# Patient Record
Sex: Female | Born: 1970
Health system: Southern US, Community
[De-identification: ages and names within clinical notes are randomized; demographics above are authoritative.]

## PROBLEM LIST (undated history)

## (undated) DIAGNOSIS — E039 Hypothyroidism, unspecified: Secondary | ICD-10-CM

## (undated) DIAGNOSIS — K219 Gastro-esophageal reflux disease without esophagitis: Secondary | ICD-10-CM

## (undated) HISTORY — PX: WISDOM TOOTH EXTRACTION: SHX21

---

## 1998-12-24 ENCOUNTER — Inpatient Hospital Stay (HOSPITAL_COMMUNITY): Admission: AD | Admit: 1998-12-24 | Discharge: 1998-12-27 | Payer: Self-pay | Admitting: Obstetrics & Gynecology

## 1999-01-26 ENCOUNTER — Other Ambulatory Visit: Admission: RE | Admit: 1999-01-26 | Discharge: 1999-01-26 | Payer: Self-pay | Admitting: Obstetrics & Gynecology

## 1999-05-26 ENCOUNTER — Encounter: Payer: Self-pay | Admitting: Family Medicine

## 1999-05-26 ENCOUNTER — Ambulatory Visit (HOSPITAL_COMMUNITY): Admission: RE | Admit: 1999-05-26 | Discharge: 1999-05-26 | Payer: Self-pay | Admitting: Family Medicine

## 1999-10-04 ENCOUNTER — Emergency Department (HOSPITAL_COMMUNITY): Admission: EM | Admit: 1999-10-04 | Discharge: 1999-10-04 | Payer: Self-pay | Admitting: Emergency Medicine

## 2000-03-01 ENCOUNTER — Other Ambulatory Visit: Admission: RE | Admit: 2000-03-01 | Discharge: 2000-03-01 | Payer: Self-pay | Admitting: Obstetrics & Gynecology

## 2001-04-20 ENCOUNTER — Other Ambulatory Visit: Admission: RE | Admit: 2001-04-20 | Discharge: 2001-04-20 | Payer: Self-pay | Admitting: Obstetrics & Gynecology

## 2002-05-08 ENCOUNTER — Other Ambulatory Visit: Admission: RE | Admit: 2002-05-08 | Discharge: 2002-05-08 | Payer: Self-pay | Admitting: Obstetrics and Gynecology

## 2003-05-30 ENCOUNTER — Other Ambulatory Visit: Admission: RE | Admit: 2003-05-30 | Discharge: 2003-05-30 | Payer: Self-pay | Admitting: Obstetrics & Gynecology

## 2004-04-15 ENCOUNTER — Encounter: Admission: RE | Admit: 2004-04-15 | Discharge: 2004-04-15 | Payer: Self-pay | Admitting: Family Medicine

## 2004-06-11 ENCOUNTER — Other Ambulatory Visit: Admission: RE | Admit: 2004-06-11 | Discharge: 2004-06-11 | Payer: Self-pay | Admitting: Obstetrics & Gynecology

## 2004-06-17 ENCOUNTER — Ambulatory Visit (HOSPITAL_COMMUNITY): Admission: RE | Admit: 2004-06-17 | Discharge: 2004-06-17 | Payer: Self-pay | Admitting: Obstetrics & Gynecology

## 2005-08-03 ENCOUNTER — Other Ambulatory Visit: Admission: RE | Admit: 2005-08-03 | Discharge: 2005-08-03 | Payer: Self-pay | Admitting: Obstetrics & Gynecology

## 2013-08-28 ENCOUNTER — Other Ambulatory Visit: Payer: Self-pay | Admitting: Physician Assistant

## 2013-08-28 DIAGNOSIS — R1011 Right upper quadrant pain: Secondary | ICD-10-CM

## 2013-09-05 ENCOUNTER — Ambulatory Visit
Admission: RE | Admit: 2013-09-05 | Discharge: 2013-09-05 | Disposition: A | Discharge status: Home / Self Care | Payer: 59 | Source: Ambulatory Visit | Attending: Physician Assistant | Admitting: Physician Assistant

## 2013-09-05 DIAGNOSIS — R1011 Right upper quadrant pain: Secondary | ICD-10-CM

## 2014-12-24 ENCOUNTER — Other Ambulatory Visit: Payer: Self-pay | Admitting: Obstetrics & Gynecology

## 2014-12-24 DIAGNOSIS — R928 Other abnormal and inconclusive findings on diagnostic imaging of breast: Secondary | ICD-10-CM

## 2015-01-02 ENCOUNTER — Ambulatory Visit
Admission: RE | Admit: 2015-01-02 | Discharge: 2015-01-02 | Disposition: A | Discharge status: Home / Self Care | Payer: 59 | Source: Ambulatory Visit | Attending: Obstetrics & Gynecology | Admitting: Obstetrics & Gynecology

## 2015-01-02 DIAGNOSIS — R928 Other abnormal and inconclusive findings on diagnostic imaging of breast: Secondary | ICD-10-CM

## 2015-05-21 DIAGNOSIS — H524 Presbyopia: Secondary | ICD-10-CM | POA: Diagnosis not present

## 2015-05-21 DIAGNOSIS — H52223 Regular astigmatism, bilateral: Secondary | ICD-10-CM | POA: Diagnosis not present

## 2015-05-21 DIAGNOSIS — G44009 Cluster headache syndrome, unspecified, not intractable: Secondary | ICD-10-CM | POA: Diagnosis not present

## 2015-05-21 DIAGNOSIS — H5213 Myopia, bilateral: Secondary | ICD-10-CM | POA: Diagnosis not present

## 2015-05-21 DIAGNOSIS — H43393 Other vitreous opacities, bilateral: Secondary | ICD-10-CM | POA: Diagnosis not present

## 2015-07-17 MED FILL — SYNTHROID 125 MCG TABLET: 125 | 90 days supply | Qty: 90 | Fill #0

## 2015-09-09 DIAGNOSIS — R040 Epistaxis: Secondary | ICD-10-CM | POA: Diagnosis not present

## 2015-10-01 DIAGNOSIS — Z0000 Encounter for general adult medical examination without abnormal findings: Secondary | ICD-10-CM | POA: Diagnosis not present

## 2015-10-01 DIAGNOSIS — E039 Hypothyroidism, unspecified: Secondary | ICD-10-CM | POA: Diagnosis not present

## 2015-10-01 DIAGNOSIS — L309 Dermatitis, unspecified: Secondary | ICD-10-CM | POA: Diagnosis not present

## 2015-10-01 DIAGNOSIS — E559 Vitamin D deficiency, unspecified: Secondary | ICD-10-CM | POA: Diagnosis not present

## 2015-10-01 DIAGNOSIS — E663 Overweight: Secondary | ICD-10-CM | POA: Diagnosis not present

## 2015-10-01 DIAGNOSIS — Z6825 Body mass index (BMI) 25.0-25.9, adult: Secondary | ICD-10-CM | POA: Diagnosis not present

## 2015-10-01 MED FILL — TRIAMCINOLONE 0.1% CREAM: 0.1 | 7 days supply | Qty: 30 | Fill #0

## 2015-10-02 DIAGNOSIS — R040 Epistaxis: Secondary | ICD-10-CM | POA: Diagnosis not present

## 2015-10-02 MED FILL — SYNTHROID 112 MCG TABLET: 112 | 90 days supply | Qty: 90 | Fill #0

## 2015-11-19 DIAGNOSIS — E039 Hypothyroidism, unspecified: Secondary | ICD-10-CM | POA: Diagnosis not present

## 2015-12-31 MED FILL — SYNTHROID 112 MCG TABLET: 112 | 90 days supply | Qty: 90 | Fill #1

## 2016-01-12 DIAGNOSIS — Z780 Asymptomatic menopausal state: Secondary | ICD-10-CM | POA: Diagnosis not present

## 2016-01-12 DIAGNOSIS — Z1231 Encounter for screening mammogram for malignant neoplasm of breast: Secondary | ICD-10-CM | POA: Diagnosis not present

## 2016-01-12 DIAGNOSIS — Z01419 Encounter for gynecological examination (general) (routine) without abnormal findings: Secondary | ICD-10-CM | POA: Diagnosis not present

## 2016-01-12 DIAGNOSIS — Z6826 Body mass index (BMI) 26.0-26.9, adult: Secondary | ICD-10-CM | POA: Diagnosis not present

## 2016-01-12 DIAGNOSIS — R5383 Other fatigue: Secondary | ICD-10-CM | POA: Diagnosis not present

## 2016-01-21 DIAGNOSIS — Z13820 Encounter for screening for osteoporosis: Secondary | ICD-10-CM | POA: Diagnosis not present

## 2016-01-21 DIAGNOSIS — N951 Menopausal and female climacteric states: Secondary | ICD-10-CM | POA: Diagnosis not present

## 2016-01-21 MED FILL — PROGESTERONE 100 MG CAPSULE: 100 | 90 days supply | Qty: 90 | Fill #0

## 2016-01-23 MED FILL — PREMARIN 0.625 MG TABLET: 0.625 | 90 days supply | Qty: 90 | Fill #0

## 2016-02-23 DIAGNOSIS — H578 Other specified disorders of eye and adnexa: Secondary | ICD-10-CM | POA: Diagnosis not present

## 2016-02-23 DIAGNOSIS — Z6826 Body mass index (BMI) 26.0-26.9, adult: Secondary | ICD-10-CM | POA: Diagnosis not present

## 2016-02-23 MED FILL — AZELASTINE HCL 0.05% DROPS: 0.05 | 30 days supply | Qty: 6 | Fill #0

## 2016-03-22 DIAGNOSIS — H1045 Other chronic allergic conjunctivitis: Secondary | ICD-10-CM | POA: Diagnosis not present

## 2016-03-22 MED FILL — PAZEO 0.7% EYE DROPS: 0.7 | 25 days supply | Qty: 3 | Fill #0

## 2016-03-22 MED FILL — FLUOROMETHOLONE 0.1% DROPS: 0.1 | 13 days supply | Qty: 5 | Fill #0

## 2016-04-05 MED FILL — SYNTHROID 112 MCG TABLET: 112 | 90 days supply | Qty: 90 | Fill #2

## 2016-05-18 DIAGNOSIS — L308 Other specified dermatitis: Secondary | ICD-10-CM | POA: Diagnosis not present

## 2016-05-18 MED FILL — TETRIX CRM: 45 days supply | Qty: 90 | Fill #0

## 2016-05-18 MED FILL — CLOBETASOL 0.05% CREAM: 0.05 | 14 days supply | Qty: 60 | Fill #0

## 2016-07-05 DIAGNOSIS — Z0182 Encounter for allergy testing: Secondary | ICD-10-CM | POA: Diagnosis not present

## 2016-07-05 DIAGNOSIS — L239 Allergic contact dermatitis, unspecified cause: Secondary | ICD-10-CM | POA: Diagnosis not present

## 2016-07-09 DIAGNOSIS — L239 Allergic contact dermatitis, unspecified cause: Secondary | ICD-10-CM | POA: Diagnosis not present

## 2016-07-09 DIAGNOSIS — L2081 Atopic neurodermatitis: Secondary | ICD-10-CM | POA: Diagnosis not present

## 2016-07-09 DIAGNOSIS — Z0182 Encounter for allergy testing: Secondary | ICD-10-CM | POA: Diagnosis not present

## 2016-07-09 MED FILL — hydrOXYzine HCL 10 MG TABS: 10 | 20 days supply | Qty: 60 | Fill #0

## 2016-07-12 MED FILL — SYNTHROID 112 MCG TABLET: 112 | 90 days supply | Qty: 90 | Fill #3

## 2016-09-21 MED FILL — PREMARIN 0.625 MG TABLET: 0.625 | 90 days supply | Qty: 90 | Fill #1

## 2016-09-21 MED FILL — PROGESTERONE 100 MG CAPSULE: 100 | 90 days supply | Qty: 90 | Fill #1

## 2016-09-30 DIAGNOSIS — E538 Deficiency of other specified B group vitamins: Secondary | ICD-10-CM | POA: Diagnosis not present

## 2016-09-30 DIAGNOSIS — Z6827 Body mass index (BMI) 27.0-27.9, adult: Secondary | ICD-10-CM | POA: Diagnosis not present

## 2016-09-30 DIAGNOSIS — Z1389 Encounter for screening for other disorder: Secondary | ICD-10-CM | POA: Diagnosis not present

## 2016-09-30 DIAGNOSIS — R5381 Other malaise: Secondary | ICD-10-CM | POA: Diagnosis not present

## 2016-09-30 DIAGNOSIS — E559 Vitamin D deficiency, unspecified: Secondary | ICD-10-CM | POA: Diagnosis not present

## 2016-09-30 DIAGNOSIS — E039 Hypothyroidism, unspecified: Secondary | ICD-10-CM | POA: Diagnosis not present

## 2016-09-30 DIAGNOSIS — E663 Overweight: Secondary | ICD-10-CM | POA: Diagnosis not present

## 2016-09-30 DIAGNOSIS — L309 Dermatitis, unspecified: Secondary | ICD-10-CM | POA: Diagnosis not present

## 2016-10-07 MED FILL — SYNTHROID 112 MCG TABLET: 112 | 90 days supply | Qty: 90 | Fill #0

## 2016-12-13 DIAGNOSIS — D225 Melanocytic nevi of trunk: Secondary | ICD-10-CM | POA: Diagnosis not present

## 2016-12-13 DIAGNOSIS — L820 Inflamed seborrheic keratosis: Secondary | ICD-10-CM | POA: Diagnosis not present

## 2016-12-13 DIAGNOSIS — L814 Other melanin hyperpigmentation: Secondary | ICD-10-CM | POA: Diagnosis not present

## 2016-12-13 DIAGNOSIS — L821 Other seborrheic keratosis: Secondary | ICD-10-CM | POA: Diagnosis not present

## 2017-01-10 MED FILL — SYNTHROID 112 MCG TABLET: 112 | 90 days supply | Qty: 90 | Fill #1

## 2017-01-10 MED FILL — PROGESTERONE 100 MG CAPSULE: 100 | 90 days supply | Qty: 90 | Fill #2

## 2017-01-10 MED FILL — PREMARIN 0.625 MG TABLET: 0.625 | 90 days supply | Qty: 90 | Fill #2

## 2017-01-20 DIAGNOSIS — Z1231 Encounter for screening mammogram for malignant neoplasm of breast: Secondary | ICD-10-CM | POA: Diagnosis not present

## 2017-02-02 DIAGNOSIS — Z01419 Encounter for gynecological examination (general) (routine) without abnormal findings: Secondary | ICD-10-CM | POA: Diagnosis not present

## 2017-02-02 DIAGNOSIS — Z6827 Body mass index (BMI) 27.0-27.9, adult: Secondary | ICD-10-CM | POA: Diagnosis not present

## 2017-05-12 MED FILL — SYNTHROID 112 MCG TABLET: 112 | 90 days supply | Qty: 90 | Fill #2

## 2017-05-13 MED FILL — PREMARIN 1.25 MG TABLET: 1.25 | 30 days supply | Qty: 30 | Fill #0

## 2017-05-13 MED FILL — PROGESTERONE 100 MG CAPSULE: 100 | 90 days supply | Qty: 90 | Fill #0

## 2017-05-28 DIAGNOSIS — R509 Fever, unspecified: Secondary | ICD-10-CM | POA: Diagnosis not present

## 2017-05-28 DIAGNOSIS — N3001 Acute cystitis with hematuria: Secondary | ICD-10-CM | POA: Diagnosis not present

## 2017-06-01 MED FILL — CIPROFLOXACIN HCL 500 MG TA: 500 | 3 days supply | Qty: 6 | Fill #0

## 2017-06-17 MED FILL — hydrOXYzine HCL 10 MG TABS: 10 | 20 days supply | Qty: 60 | Fill #1

## 2017-07-21 MED FILL — PREMARIN 1.25 MG TABLET: 1.25 | 30 days supply | Qty: 30 | Fill #1

## 2017-08-15 MED FILL — SYNTHROID 112 MCG TABLET: 112 | 90 days supply | Qty: 90 | Fill #3

## 2017-09-01 MED FILL — PROGESTERONE 100 MG CAPSULE: 100 | 90 days supply | Qty: 90 | Fill #0

## 2017-09-01 MED FILL — PREMARIN 1.25 MG TABLET: 1.25 | 30 days supply | Qty: 30 | Fill #2

## 2017-10-19 DIAGNOSIS — Z0000 Encounter for general adult medical examination without abnormal findings: Secondary | ICD-10-CM | POA: Diagnosis not present

## 2017-10-19 DIAGNOSIS — E559 Vitamin D deficiency, unspecified: Secondary | ICD-10-CM | POA: Diagnosis not present

## 2017-10-19 DIAGNOSIS — E039 Hypothyroidism, unspecified: Secondary | ICD-10-CM | POA: Diagnosis not present

## 2017-10-19 DIAGNOSIS — E663 Overweight: Secondary | ICD-10-CM | POA: Diagnosis not present

## 2017-10-19 DIAGNOSIS — Z6825 Body mass index (BMI) 25.0-25.9, adult: Secondary | ICD-10-CM | POA: Diagnosis not present

## 2017-10-19 MED FILL — CLOBETASOL PROPIONATE 0.05: 0.05 | 20 days supply | Qty: 45 | Fill #0

## 2017-10-20 ENCOUNTER — Other Ambulatory Visit: Payer: Self-pay | Admitting: Nurse Practitioner

## 2017-10-20 ENCOUNTER — Other Ambulatory Visit: Payer: Self-pay | Admitting: Physician Assistant

## 2017-10-20 DIAGNOSIS — E049 Nontoxic goiter, unspecified: Secondary | ICD-10-CM

## 2017-10-20 MED FILL — PREMARIN 1.25 MG TABLET: 1.25 | 30 days supply | Qty: 30 | Fill #3

## 2017-10-28 ENCOUNTER — Ambulatory Visit
Admission: RE | Admit: 2017-10-28 | Discharge: 2017-10-28 | Disposition: A | Discharge status: Home / Self Care | Payer: 59 | Source: Ambulatory Visit | Attending: Nurse Practitioner | Admitting: Nurse Practitioner

## 2017-10-28 DIAGNOSIS — E079 Disorder of thyroid, unspecified: Secondary | ICD-10-CM | POA: Diagnosis not present

## 2017-10-28 DIAGNOSIS — E049 Nontoxic goiter, unspecified: Secondary | ICD-10-CM

## 2017-11-16 MED FILL — SYNTHROID 112 MCG TABLET: 112 | 90 days supply | Qty: 90 | Fill #0

## 2017-11-25 MED FILL — PREMARIN 1.25 MG TABLET: 1.25 | 30 days supply | Qty: 30 | Fill #4

## 2017-12-02 ENCOUNTER — Other Ambulatory Visit: Payer: Self-pay | Admitting: General Surgery

## 2017-12-02 DIAGNOSIS — R109 Unspecified abdominal pain: Secondary | ICD-10-CM

## 2017-12-07 ENCOUNTER — Other Ambulatory Visit: Payer: Self-pay | Admitting: General Surgery

## 2017-12-07 ENCOUNTER — Ambulatory Visit
Admission: RE | Admit: 2017-12-07 | Discharge: 2017-12-07 | Disposition: A | Discharge status: Home / Self Care | Payer: 59 | Source: Ambulatory Visit | Attending: General Surgery | Admitting: General Surgery

## 2017-12-07 ENCOUNTER — Encounter (HOSPITAL_COMMUNITY): Payer: Self-pay | Admitting: *Deleted

## 2017-12-07 ENCOUNTER — Other Ambulatory Visit: Payer: Self-pay

## 2017-12-07 DIAGNOSIS — K8020 Calculus of gallbladder without cholecystitis without obstruction: Secondary | ICD-10-CM | POA: Diagnosis not present

## 2017-12-07 DIAGNOSIS — R109 Unspecified abdominal pain: Secondary | ICD-10-CM | POA: Diagnosis not present

## 2017-12-07 NOTE — Anesthesia Preprocedure Evaluation (Addendum)
Anesthesia Evaluation  Patient identified by MRN, date of birth, ID band Patient awake    Reviewed: Allergy & Precautions, H&P , NPO status , Patient's Chart, lab work & pertinent test results  Airway Mallampati: III  TM Distance: >3 FB Neck ROM: Full    Dental no notable dental hx. (+) Teeth Intact, Dental Advisory Given   Pulmonary neg pulmonary ROS,    Pulmonary exam normal breath sounds clear to auscultation       Cardiovascular Exercise Tolerance: Good negative cardio ROS   Rhythm:Regular Rate:Normal     Neuro/Psych negative neurological ROS  negative psych ROS   GI/Hepatic Neg liver ROS, GERD  Medicated,  Endo/Other  Hypothyroidism   Renal/GU negative Renal ROS  negative genitourinary   Musculoskeletal   Abdominal   Peds  Hematology negative hematology ROS (+)   Anesthesia Other Findings   Reproductive/Obstetrics negative OB ROS                            Anesthesia Physical Anesthesia Plan  ASA: II  Anesthesia Plan: General   Post-op Pain Management:    Induction: Intravenous  PONV Risk Score and Plan: 4 or greater and Ondansetron, Dexamethasone and Midazolam  Airway Management Planned: Oral ETT  Additional Equipment:   Intra-op Plan:   Post-operative Plan: Extubation in OR  Informed Consent: I have reviewed the patients History and Physical, chart, labs and discussed the procedure including the risks, benefits and alternatives for the proposed anesthesia with the patient or authorized representative who has indicated his/her understanding and acceptance.   Dental advisory given  Plan Discussed with: CRNA  Anesthesia Plan Comments:         Anesthesia Quick Evaluation

## 2017-12-07 NOTE — Progress Notes (Addendum)
Spoke with pt's husband, Lynnae Sandhoff for pre-op call. Pt was taking daughter to college and not available. Lynnae Sandhoff states that pt does not have a cardiac history, HTN, or diabetes. Lynnae Sandhoff is to come by and pick up the Ensure drink this afternoon.Instructed him to have pt drink it by 4:30 AM.

## 2017-12-08 ENCOUNTER — Ambulatory Visit (HOSPITAL_COMMUNITY): Payer: 59 | Admitting: Certified Registered Nurse Anesthetist

## 2017-12-08 ENCOUNTER — Encounter (HOSPITAL_COMMUNITY)
Admission: RE | Disposition: A | Discharge status: Home / Self Care | Payer: Self-pay | Source: Ambulatory Visit | Attending: General Surgery

## 2017-12-08 ENCOUNTER — Encounter (HOSPITAL_COMMUNITY): Payer: Self-pay | Admitting: General Practice

## 2017-12-08 ENCOUNTER — Ambulatory Visit (HOSPITAL_COMMUNITY)
Admission: RE | Admit: 2017-12-08 | Discharge: 2017-12-08 | Disposition: A | Discharge status: Home / Self Care | Payer: 59 | Source: Ambulatory Visit | Attending: General Surgery | Admitting: General Surgery

## 2017-12-08 DIAGNOSIS — K8020 Calculus of gallbladder without cholecystitis without obstruction: Secondary | ICD-10-CM | POA: Diagnosis not present

## 2017-12-08 DIAGNOSIS — K8010 Calculus of gallbladder with chronic cholecystitis without obstruction: Secondary | ICD-10-CM | POA: Diagnosis not present

## 2017-12-08 DIAGNOSIS — E039 Hypothyroidism, unspecified: Secondary | ICD-10-CM | POA: Diagnosis not present

## 2017-12-08 DIAGNOSIS — K219 Gastro-esophageal reflux disease without esophagitis: Secondary | ICD-10-CM | POA: Insufficient documentation

## 2017-12-08 DIAGNOSIS — Z79899 Other long term (current) drug therapy: Secondary | ICD-10-CM | POA: Diagnosis not present

## 2017-12-08 HISTORY — DX: Hypothyroidism, unspecified: E03.9

## 2017-12-08 HISTORY — PX: CHOLECYSTECTOMY: SHX55

## 2017-12-08 HISTORY — DX: Gastro-esophageal reflux disease without esophagitis: K21.9

## 2017-12-08 SURGERY — LAPAROSCOPIC CHOLECYSTECTOMY
Anesthesia: General | Site: Abdomen

## 2017-12-08 MED ORDER — PROPOFOL 10 MG/ML IV BOLUS
INTRAVENOUS | Status: AC
  Filled 2017-12-08: qty 20

## 2017-12-08 MED ORDER — SODIUM CHLORIDE 0.9 % IR SOLN
Status: DC | PRN
  Administered 2017-12-08: 1000 mL

## 2017-12-08 MED ORDER — ONDANSETRON HCL 4 MG/2ML IJ SOLN
INTRAMUSCULAR | Status: DC | PRN
  Administered 2017-12-08 (×2): 4 mg via INTRAVENOUS

## 2017-12-08 MED ORDER — DEXAMETHASONE SODIUM PHOSPHATE 10 MG/ML IJ SOLN
INTRAMUSCULAR | Status: AC
  Filled 2017-12-08: qty 1

## 2017-12-08 MED ORDER — PROPOFOL 10 MG/ML IV BOLUS
INTRAVENOUS | Status: DC | PRN
  Administered 2017-12-08: 100 mg via INTRAVENOUS

## 2017-12-08 MED ORDER — DEXAMETHASONE SODIUM PHOSPHATE 10 MG/ML IJ SOLN
INTRAMUSCULAR | Status: DC | PRN
  Administered 2017-12-08: 10 mg via INTRAVENOUS

## 2017-12-08 MED ORDER — LIDOCAINE 2% (20 MG/ML) 5 ML SYRINGE
INTRAMUSCULAR | Status: DC | PRN
  Administered 2017-12-08: 40 mg via INTRAVENOUS

## 2017-12-08 MED ORDER — CEFAZOLIN SODIUM-DEXTROSE 2-4 GM/100ML-% IV SOLN
2.0000 g | INTRAVENOUS | Status: AC
  Administered 2017-12-08: 2 g via INTRAVENOUS
  Filled 2017-12-08: qty 100

## 2017-12-08 MED ORDER — MIDAZOLAM HCL 2 MG/2ML IJ SOLN
INTRAMUSCULAR | Status: AC
  Filled 2017-12-08: qty 2

## 2017-12-08 MED ORDER — ONDANSETRON HCL 4 MG/2ML IJ SOLN
INTRAMUSCULAR | Status: AC
  Filled 2017-12-08: qty 4

## 2017-12-08 MED ORDER — PROMETHAZINE HCL 25 MG/ML IJ SOLN
6.2500 mg | INTRAMUSCULAR | Status: DC | PRN
  Administered 2017-12-08: 6.25 mg via INTRAVENOUS

## 2017-12-08 MED ORDER — HYDROMORPHONE HCL 1 MG/ML IJ SOLN
INTRAMUSCULAR | Status: AC
  Filled 2017-12-08: qty 1

## 2017-12-08 MED ORDER — PROMETHAZINE HCL 25 MG/ML IJ SOLN
INTRAMUSCULAR | Status: AC
  Filled 2017-12-08: qty 1

## 2017-12-08 MED ORDER — GABAPENTIN 100 MG PO CAPS
100.0000 mg | ORAL_CAPSULE | ORAL | Status: AC
  Administered 2017-12-08: 100 mg via ORAL
  Filled 2017-12-08: qty 1

## 2017-12-08 MED ORDER — ACETAMINOPHEN 500 MG PO TABS
1000.0000 mg | ORAL_TABLET | ORAL | Status: AC
  Administered 2017-12-08: 1000 mg via ORAL
  Filled 2017-12-08: qty 2

## 2017-12-08 MED ORDER — ENSURE PRE-SURGERY PO LIQD
296.0000 mL | Freq: Once | ORAL | Status: DC
  Filled 2017-12-08: qty 296

## 2017-12-08 MED ORDER — BUPIVACAINE-EPINEPHRINE (PF) 0.25% -1:200000 IJ SOLN
INTRAMUSCULAR | Status: AC
  Filled 2017-12-08: qty 30

## 2017-12-08 MED ORDER — FENTANYL CITRATE (PF) 250 MCG/5ML IJ SOLN
INTRAMUSCULAR | Status: AC
  Filled 2017-12-08: qty 5

## 2017-12-08 MED ORDER — HYDROMORPHONE HCL 1 MG/ML IJ SOLN
0.2500 mg | INTRAMUSCULAR | Status: DC | PRN
  Administered 2017-12-08: 0.5 mg via INTRAVENOUS

## 2017-12-08 MED ORDER — ROCURONIUM BROMIDE 50 MG/5ML IV SOSY
PREFILLED_SYRINGE | INTRAVENOUS | Status: DC | PRN
  Administered 2017-12-08: 30 mg via INTRAVENOUS

## 2017-12-08 MED ORDER — LACTATED RINGERS IV SOLN
INTRAVENOUS | Status: DC | PRN
  Administered 2017-12-08: 07:00:00 via INTRAVENOUS

## 2017-12-08 MED ORDER — FENTANYL CITRATE (PF) 250 MCG/5ML IJ SOLN
INTRAMUSCULAR | Status: DC | PRN
  Administered 2017-12-08 (×3): 50 ug via INTRAVENOUS

## 2017-12-08 MED ORDER — MIDAZOLAM HCL 5 MG/5ML IJ SOLN
INTRAMUSCULAR | Status: DC | PRN
  Administered 2017-12-08: 2 mg via INTRAVENOUS

## 2017-12-08 MED ORDER — OXYCODONE HCL 5 MG PO TABS: 5.0000 mg | ORAL_TABLET | Freq: Four times a day (QID) | ORAL | 0 refills | Status: DC | PRN

## 2017-12-08 MED ORDER — EPHEDRINE 5 MG/ML INJ
INTRAVENOUS | Status: AC
  Filled 2017-12-08: qty 10

## 2017-12-08 MED ORDER — BUPIVACAINE-EPINEPHRINE 0.25% -1:200000 IJ SOLN
INTRAMUSCULAR | Status: DC | PRN
  Administered 2017-12-08: 12 mL

## 2017-12-08 MED ORDER — SUGAMMADEX SODIUM 200 MG/2ML IV SOLN
INTRAVENOUS | Status: DC | PRN
  Administered 2017-12-08: 200 mg via INTRAVENOUS

## 2017-12-08 MED ORDER — 0.9 % SODIUM CHLORIDE (POUR BTL) OPTIME
TOPICAL | Status: DC | PRN
  Administered 2017-12-08: 1000 mL

## 2017-12-08 MED ORDER — KETOROLAC TROMETHAMINE 15 MG/ML IJ SOLN
15.0000 mg | INTRAMUSCULAR | Status: AC
  Administered 2017-12-08: 15 mg via INTRAVENOUS
  Filled 2017-12-08: qty 1

## 2017-12-08 MED ORDER — ROCURONIUM BROMIDE 50 MG/5ML IV SOSY
PREFILLED_SYRINGE | INTRAVENOUS | Status: AC
  Filled 2017-12-08: qty 5

## 2017-12-08 MED ORDER — LIDOCAINE 2% (20 MG/ML) 5 ML SYRINGE
INTRAMUSCULAR | Status: AC
  Filled 2017-12-08: qty 5

## 2017-12-08 MED ORDER — EPHEDRINE SULFATE 50 MG/ML IJ SOLN
INTRAMUSCULAR | Status: DC | PRN
  Administered 2017-12-08: 5 mg via INTRAVENOUS

## 2017-12-08 MED FILL — oxyCODONE HCL 5 MG TABS: 5 | 2 days supply | Qty: 10 | Fill #0

## 2017-12-08 SURGICAL SUPPLY — 42 items
ADH SKN CLS APL DERMABOND .7 (GAUZE/BANDAGES/DRESSINGS) ×1
APPLIER CLIP 5 13 M/L LIGAMAX5 (MISCELLANEOUS) ×2
APR CLP MED LRG 5 ANG JAW (MISCELLANEOUS) ×1
BAG SPEC RTRVL 10 TROC 200 (ENDOMECHANICALS) ×1
BLADE CLIPPER SURG (BLADE) IMPLANT
CANISTER SUCT 3000ML PPV (MISCELLANEOUS) ×2 IMPLANT
CHLORAPREP W/TINT 26ML (MISCELLANEOUS) ×2 IMPLANT
CLIP APPLIE 5 13 M/L LIGAMAX5 (MISCELLANEOUS) ×1 IMPLANT
COVER SURGICAL LIGHT HANDLE (MISCELLANEOUS) ×2 IMPLANT
DERMABOND ADVANCED (GAUZE/BANDAGES/DRESSINGS) ×1
DERMABOND ADVANCED .7 DNX12 (GAUZE/BANDAGES/DRESSINGS) ×1 IMPLANT
ELECT REM PT RETURN 9FT ADLT (ELECTROSURGICAL) ×2
ELECTRODE REM PT RTRN 9FT ADLT (ELECTROSURGICAL) ×1 IMPLANT
GLOVE BIO SURGEON STRL SZ7 (GLOVE) ×2 IMPLANT
GLOVE BIO SURGEON STRL SZ7.5 (GLOVE) ×2 IMPLANT
GLOVE BIOGEL PI IND STRL 7.5 (GLOVE) ×1 IMPLANT
GLOVE BIOGEL PI INDICATOR 7.5 (GLOVE) ×1
GLOVE INDICATOR 7.5 STRL GRN (GLOVE) ×2 IMPLANT
GOWN STRL REUS W/ TWL LRG LVL3 (GOWN DISPOSABLE) ×3 IMPLANT
GOWN STRL REUS W/TWL LRG LVL3 (GOWN DISPOSABLE) ×6
GRASPER SUT TROCAR 14GX15 (MISCELLANEOUS) ×2 IMPLANT
KIT BASIN OR (CUSTOM PROCEDURE TRAY) ×2 IMPLANT
KIT TURNOVER KIT B (KITS) ×2 IMPLANT
NS IRRIG 1000ML POUR BTL (IV SOLUTION) ×2 IMPLANT
PAD ARMBOARD 7.5X6 YLW CONV (MISCELLANEOUS) ×2 IMPLANT
POUCH RETRIEVAL ECOSAC 10 (ENDOMECHANICALS) ×1 IMPLANT
POUCH RETRIEVAL ECOSAC 10MM (ENDOMECHANICALS) ×1
SCISSORS LAP 5X35 DISP (ENDOMECHANICALS) ×2 IMPLANT
SET IRRIG TUBING LAPAROSCOPIC (IRRIGATION / IRRIGATOR) ×2 IMPLANT
SET TUBE SMOKE EVAC HIGH FLOW (TUBING) ×1 IMPLANT
SLEEVE ENDOPATH XCEL 5M (ENDOMECHANICALS) ×4 IMPLANT
SPECIMEN JAR SMALL (MISCELLANEOUS) ×2 IMPLANT
STRIP CLOSURE SKIN 1/2X4 (GAUZE/BANDAGES/DRESSINGS) ×2 IMPLANT
SUT MNCRL AB 4-0 PS2 18 (SUTURE) ×2 IMPLANT
SUT VICRYL 0 UR6 27IN ABS (SUTURE) ×2 IMPLANT
TOWEL OR 17X24 6PK STRL BLUE (TOWEL DISPOSABLE) ×2 IMPLANT
TOWEL OR 17X26 10 PK STRL BLUE (TOWEL DISPOSABLE) ×2 IMPLANT
TRAY LAPAROSCOPIC MC (CUSTOM PROCEDURE TRAY) ×2 IMPLANT
TROCAR XCEL BLUNT TIP 100MML (ENDOMECHANICALS) ×2 IMPLANT
TROCAR XCEL NON-BLD 5MMX100MML (ENDOMECHANICALS) ×2 IMPLANT
TUBING INSUFFLATION (TUBING) ×2 IMPLANT
WATER STERILE IRR 1000ML POUR (IV SOLUTION) ×2 IMPLANT

## 2017-12-08 NOTE — H&P (Signed)
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 this has become much worse and more frequent. has three episodes recently last in [place] after eating coconut shrimp. these are in ruq radiating to the back. associated with heartburn. she has undergone an [place] that I have reviewed with radiology showing gallstones. she is here with her husband to discuss options today   Past Surgical History (April Staton, [place]; 12/07/2017 2:08 PM) Cesarean Section - 1  Oral Surgery   Diagnostic Studies History (April Staton, [place]; 12/07/2017 2:08 PM) Colonoscopy  never Mammogram  within last year Pap Smear  1-5 years ago  Allergies (April Staton, CMA; 12/07/2017 2:09 PM) Sulfa 10 *OPHTHALMIC AGENTS*   Medication History (April Staton, CMA; 12/07/2017 2:14 PM) Synthroid (112MCG Tablet, Oral) Active. Vitamin D3 (5000UNIT Capsule, Oral) Active. Progesterone (100MG  Capsule, Oral) Active. Premarin (1.25MG  Tablet, Oral) Active.  Social History (April Staton, [place]; 12/07/2017 2:08 PM) Alcohol use  Occasional alcohol use. Caffeine use  Carbonated beverages. No drug use  Tobacco use  Never smoker.  Family History (April Staton, [place]; 12/07/2017 2:08 PM) Alcohol Abuse  Brother. Migraine Headache  Father.  Pregnancy / Birth History (April Staton, [place]; 12/07/2017 2:08 PM) Age at menarche  39 years. Age of menopause  <43 Contraceptive History  Oral contraceptives. Gravida  2 Maternal age  39-25 Para  2  Other Problems (April Staton, [place]; 12/07/2017 2:08 PM) Gastroesophageal Reflux Disease  Migraine Headache  Thyroid Disease     Review of Systems (April Staton CMA; 12/07/2017 2:08 PM) Skin Not Present- Change in Wart/Mole, Dryness, Hives, Jaundice, New Lesions, Non-Healing Wounds, Rash and Ulcer. HEENT Not Present- Earache, Hearing Loss, Hoarseness, Nose Bleed, Oral Ulcers, Ringing in the Ears, Seasonal Allergies, Sinus Pain, Sore Throat, Visual  Disturbances, Wears glasses/contact lenses and Yellow Eyes. Respiratory Not Present- Bloody sputum, Chronic Cough, Difficulty Breathing, Snoring and Wheezing. Cardiovascular Not Present- Chest Pain, Difficulty Breathing Lying Down, Leg Cramps, Palpitations, Rapid Heart Rate, Shortness of Breath and Swelling of Extremities. Gastrointestinal Present- Abdominal Pain, Bloating, Gets full quickly at meals, Indigestion and Nausea. Not Present- Bloody Stool, Change in Bowel Habits, Chronic diarrhea, Constipation, Difficulty Swallowing, Excessive gas, Hemorrhoids, Rectal Pain and Vomiting. Female Genitourinary Not Present- Frequency, Nocturia, Painful Urination, Pelvic Pain and Urgency. Musculoskeletal Not Present- Back Pain, Joint Pain, Joint Stiffness, Muscle Pain, Muscle Weakness and Swelling of Extremities. Neurological Not Present- Decreased Memory, Fainting, Headaches, Numbness, Seizures, Tingling, Tremor, Trouble walking and Weakness. Psychiatric Not Present- Anxiety, Bipolar, Change in Sleep Pattern, Depression, Fearful and Frequent crying. Endocrine Not Present- Cold Intolerance, Excessive Hunger, Hair Changes, Heat Intolerance, Hot flashes and New Diabetes. Hematology Not Present- Blood Thinners, Easy Bruising, Excessive bleeding, Gland problems, HIV and Persistent Infections.  Vitals (April Staton CMA; 12/07/2017 2:14 PM) 12/07/2017 2:14 PM Weight: 146.25 lb Height: 64in Body Surface Area: 1.71 m Body Mass Index: 25.1 kg/m  Pulse: 83 (Regular)  BP: 118/80 (Sitting, Left Arm, Standard)       Physical Exam Rolm Bookbinder MD; 12/07/2017 2:36 PM) General Mental Status-Alert. Orientation-Oriented X3.  Eye Sclera/Conjunctiva - Bilateral-No scleral icterus.  Abdomen Note: soft mildly tender ruq no murphys sign     Assessment & Plan Rolm Bookbinder MD; 12/07/2017 2:40 PM) GALLSTONES (K80.20) Story: laparoscopic cholecystectomy I discussed the procedure in  detail. We discussed the risks and benefits of a laparoscopic cholecystectomy and possible cholangiogram including, but not limited to bleeding, infection, injury to surrounding structures such as the intestine or liver, bile  leak, retained gallstones, need to convert to an open procedure, prolonged diarrhea, blood clots such as DVT, common bile duct injury, anesthesia risks, and possible need for additional procedures. The likelihood of improvement in symptoms and return to the patient's normal status is good. We discussed the typical post-operative recovery course.

## 2017-12-08 NOTE — Progress Notes (Signed)
Patient states information can be given to [facility].

## 2017-12-08 NOTE — Op Note (Signed)
Preoperative diagnosis: biliary colic Postoperative diagnosis: Same as above Procedure: Laparoscopic cholecystectomy Surgeon: Dr. Serita Grammes Estimated blood loss: minimal Anesthesia: General Specimens: Gallbladder and contents to pathology Complications: None Drains: None Sponge count was correct at completion Disposition to recovery stable condition  Indications: This a 47 year old female with biliary colic and cholelithiasis on an ultrasound.  We discussed laparoscopic cholecystectomy.  Procedure: After informed consent was obtained the patient was taken to the operating room.  She was given antibiotics.  SCDs were in place.  She was placed under general anesthesia without complication.  She was prepped and draped in the standard sterile surgical fashion.  A surgical timeout was then performed.  I infiltrated Marcaine below the umbilicus.  I made a curvilinear incision and carried this to the fascia.  I grasped the fascia with a kocher.  I entered the fascia sharply and the peritoneum bluntly.  There was no entry injury.  I placed a 0 Vicryl pursestring suture and inserted a Hassan trocar.  The abdomen was then insufflated to 15 mmHg pressure.  I then inserted 3 further 5 mm trocars in the epigastrium and right side of the abdomen under direct vision without complication.  I then was able to retract the gallbladder cephalad and lateral.  I dissected the triangle of Calot and obtain the critical view of safety.  I clipped the duct 3 times and divided leaving 2 clips in place.  The common bile duct was able to be visualized.  I then clipped the cystic artery in a similar fashion and divided that.  I then remove the gallbladder from the liver bed.  This was then placed in a bag and removed from the umbilicus.  I then obtained hemostasis in the gallbladder bed.   I then removed the [facility] trocar.  I tied down my pursestring.  I used the suture closure device to place an additional 0 Vicryl  suture to completely obliterate the umbilical defect.  I then remove the remaining trochars and desufflated the abdomen.  These were closed with 4-0 Monocryl and glue was placed.  She tolerated this well was extubated and transferred to recovery stable.

## 2017-12-08 NOTE — Anesthesia Postprocedure Evaluation (Signed)
Anesthesia Post Note  Patient: Courtney Rose  Procedure(s) Performed: LAPAROSCOPIC CHOLECYSTECTOMY ERAS PATHWAY (N/A Abdomen)     Patient location during evaluation: PACU Anesthesia Type: General Level of consciousness: awake and alert Pain management: pain level controlled Vital Signs Assessment: post-procedure vital signs reviewed and stable Respiratory status: spontaneous breathing, nonlabored ventilation and respiratory function stable Cardiovascular status: blood pressure returned to baseline and stable Postop Assessment: no apparent nausea or vomiting Anesthetic complications: no    Last Vitals:  Vitals:   12/08/17 0945 12/08/17 1000  BP: 115/78 125/82  Pulse: (!) 58 63  Resp: 13 17  Temp:  (!) 36.4 C  SpO2: 99% 100%    Last Pain:  Vitals:   12/08/17 1000  TempSrc:   PainSc: 3                  Dalton Mille,W. EDMOND

## 2017-12-08 NOTE — Interval H&P Note (Signed)
History and Physical Interval Note:  12/08/2017 7:03 AM  Courtney Rose  has presented today for surgery, with the diagnosis of gallstones  The various methods of treatment have been discussed with the patient and family. After consideration of risks, benefits and other options for treatment, the patient has consented to  Procedure(s): Grace (N/A) as a surgical intervention .  The patient's history has been reviewed, patient examined, no change in status, stable for surgery.  I have reviewed the patient's chart and labs.  Questions were answered to the patient's satisfaction.     Rolm Bookbinder

## 2017-12-08 NOTE — Transfer of Care (Signed)
Immediate Anesthesia Transfer of Care Note  Patient: Courtney Rose  Procedure(s) Performed: LAPAROSCOPIC CHOLECYSTECTOMY ERAS PATHWAY (N/A Abdomen)  Patient Location: PACU  Anesthesia Type:General  Level of Consciousness: awake, alert , oriented and patient cooperative  Airway & Oxygen Therapy: Patient Spontanous Breathing  Post-op Assessment: Report given to RN and Post -op Vital signs reviewed and stable  Post vital signs: Reviewed and stable  Last Vitals:  Vitals Value Taken Time  BP 118/78 12/08/2017  8:42 AM  Temp    Pulse 72 12/08/2017  8:44 AM  Resp 14 12/08/2017  8:44 AM  SpO2 89 % 12/08/2017  8:44 AM  Vitals shown include unvalidated device data.  Last Pain:  Vitals:   12/08/17 0639  TempSrc:   PainSc: 0-No pain         Complications: No apparent anesthesia complications

## 2017-12-08 NOTE — Discharge Instructions (Signed)
[facility] LAPAROSCOPIC SURGERY: POST OP INSTRUCTIONS Take ibuprofen 400 mg every 8 hours for next 72 hours then as needed.  Use ice daily.  Always review your discharge instruction sheet given to you by the facility where your surgery was performed. IF YOU HAVE DISABILITY OR FAMILY LEAVE FORMS, YOU MUST BRING THEM TO THE OFFICE FOR PROCESSING.   DO NOT GIVE THEM TO YOUR DOCTOR.  1. A prescription for pain medication may be given to you upon discharge.  Take your pain medication as prescribed, if needed.  If narcotic pain medicine is not needed, then you may take acetaminophen (Tylenol), naprosyn (Alleve), or ibuprofen (Advil) as needed. 2. Take your usually prescribed medications unless otherwise directed. 3. If you need a refill on your pain medication, please contact your pharmacy.  They will contact our office to request authorization. Prescriptions will not be filled after 5pm or on week-ends. 4. You should follow a light diet the first few days after arrival home, such as soup and crackers, etc.  Be sure to include lots of fluids daily. 5. Most patients will experience some swelling and bruising in the area of the incisions.  Ice packs will help.  Swelling and bruising can take several days to resolve.  6. It is common to experience some constipation if taking pain medication after surgery.  Increasing fluid intake and taking a stool softener (such as Colace) will usually help or prevent this problem from occurring.  A mild laxative (Milk of Magnesia or Miralax) should be taken according to package instructions if there are no bowel movements after 48 hours. 7. Unless discharge instructions indicate otherwise, you may remove your bandages 48 hours after surgery, and you may shower at that time.  You may have steri-strips (small skin tapes) in place directly over the incision.  These strips should be left on the skin for 7-10 days.  If your  surgeon used skin glue on the incision, you may shower in 24 hours.  The glue will flake off over the next 2-3 weeks.  Any sutures or staples will be removed at the office during your follow-up visit. 8. ACTIVITIES:  You may resume regular (light) daily activities beginning the next day--such as daily self-care, walking, climbing stairs--gradually increasing activities as tolerated.  You may have sexual intercourse when it is comfortable.  Refrain from any heavy lifting or straining until approved by your doctor. a. You may drive when you are no longer taking prescription pain medication, you can comfortably wear a seatbelt, and you can safely maneuver your car and apply brakes. b. RETURN TO WORK:  __________________________________________________________ 9. You should see your doctor in the office for a follow-up appointment approximately 2-3 weeks after your surgery.  Make sure that you call for this appointment within a day or two after you arrive home to insure a convenient appointment time. 10. OTHER INSTRUCTIONS: __________________________________________________________________________________________________________________________ __________________________________________________________________________________________________________________________ WHEN TO CALL YOUR DOCTOR: 1. Fever over 101.0 2. Inability to urinate 3. Continued bleeding from incision. 4. Increased pain, redness, or drainage from the incision. 5. Increasing abdominal pain  The clinic staff is available to answer your questions during regular business hours.  Please dont hesitate to call and ask to speak to one of the nurses for clinical concerns.  If you have a medical emergency, go to the nearest emergency room or call 911.  A surgeon from [facility] is always on call at the hospital. [address] [place], [place], [place]  [address] ? [address], [place], [place]   [address] [phone] ?  [phone] ? FAX [phone] Web site: [email or web address]

## 2017-12-09 ENCOUNTER — Encounter (HOSPITAL_COMMUNITY): Payer: Self-pay | Admitting: General Surgery

## 2018-01-19 MED FILL — PREMARIN 1.25 MG TABLET: 1.25 | 30 days supply | Qty: 30 | Fill #5

## 2018-02-02 MED FILL — PROGESTERONE 100 MG CAPSULE: 100 | 90 days supply | Qty: 90 | Fill #1

## 2018-02-06 DIAGNOSIS — Z6826 Body mass index (BMI) 26.0-26.9, adult: Secondary | ICD-10-CM | POA: Diagnosis not present

## 2018-02-06 DIAGNOSIS — Z1231 Encounter for screening mammogram for malignant neoplasm of breast: Secondary | ICD-10-CM | POA: Diagnosis not present

## 2018-02-06 DIAGNOSIS — Z01419 Encounter for gynecological examination (general) (routine) without abnormal findings: Secondary | ICD-10-CM | POA: Diagnosis not present

## 2018-02-22 MED FILL — SYNTHROID 112 MCG TABLET: 112 | 90 days supply | Qty: 90 | Fill #1

## 2018-02-23 DIAGNOSIS — R3121 Asymptomatic microscopic hematuria: Secondary | ICD-10-CM | POA: Diagnosis not present

## 2018-02-23 DIAGNOSIS — Z6825 Body mass index (BMI) 25.0-25.9, adult: Secondary | ICD-10-CM | POA: Diagnosis not present

## 2018-03-07 MED FILL — PREMARIN 1.25 MG TABLET: 1.25 | 30 days supply | Qty: 30 | Fill #0

## 2018-04-25 DIAGNOSIS — Z6826 Body mass index (BMI) 26.0-26.9, adult: Secondary | ICD-10-CM | POA: Diagnosis not present

## 2018-04-25 DIAGNOSIS — E039 Hypothyroidism, unspecified: Secondary | ICD-10-CM | POA: Diagnosis not present

## 2018-04-25 DIAGNOSIS — R3121 Asymptomatic microscopic hematuria: Secondary | ICD-10-CM | POA: Diagnosis not present

## 2018-04-27 MED FILL — SYNTHROID 125 MCG TABLET: 125 | 90 days supply | Qty: 90 | Fill #0

## 2018-04-27 MED FILL — PREMARIN 1.25 MG TABLET: 1.25 | 30 days supply | Qty: 30 | Fill #1

## 2018-06-06 MED FILL — PREMARIN 1.25 MG TABLET: 1.25 | 30 days supply | Qty: 30 | Fill #2

## 2018-06-07 DIAGNOSIS — E039 Hypothyroidism, unspecified: Secondary | ICD-10-CM | POA: Diagnosis not present

## 2018-06-08 MED FILL — SYNTHROID 137 MCG TABLET: 137 | 90 days supply | Qty: 90 | Fill #0

## 2018-06-19 MED FILL — PROGESTERONE 100 MG CAPSULE: 100 | 90 days supply | Qty: 90 | Fill #0

## 2018-07-14 MED FILL — PREMARIN 1.25 MG TABLET: 1.25 | 30 days supply | Qty: 30 | Fill #3 | Status: TO

## 2018-07-19 DIAGNOSIS — E039 Hypothyroidism, unspecified: Secondary | ICD-10-CM | POA: Diagnosis not present

## 2018-08-14 MED FILL — PREMARIN 1.25 MG TABLET: 1.25 | 30 days supply | Qty: 30 | Fill #0 | Status: TO

## 2018-09-04 MED FILL — SYNTHROID 137 MCG TABLET: 137 | 90 days supply | Qty: 90 | Fill #0

## 2018-09-26 MED FILL — PREMARIN 1.25 MG TABLET: 1.25 | 30 days supply | Qty: 30 | Fill #0

## 2018-10-13 MED FILL — PROGESTERONE 100 MG CAPSULE: 100 | 90 days supply | Qty: 90 | Fill #1

## 2018-10-23 DIAGNOSIS — E663 Overweight: Secondary | ICD-10-CM | POA: Diagnosis not present

## 2018-10-23 DIAGNOSIS — Z6827 Body mass index (BMI) 27.0-27.9, adult: Secondary | ICD-10-CM | POA: Diagnosis not present

## 2018-10-23 DIAGNOSIS — Z0000 Encounter for general adult medical examination without abnormal findings: Secondary | ICD-10-CM | POA: Diagnosis not present

## 2018-10-23 DIAGNOSIS — E039 Hypothyroidism, unspecified: Secondary | ICD-10-CM | POA: Diagnosis not present

## 2018-10-23 DIAGNOSIS — E559 Vitamin D deficiency, unspecified: Secondary | ICD-10-CM | POA: Diagnosis not present

## 2018-10-23 DIAGNOSIS — M2550 Pain in unspecified joint: Secondary | ICD-10-CM | POA: Diagnosis not present

## 2018-11-07 MED FILL — PREMARIN 1.25 MG TABLET: 1.25 | 30 days supply | Qty: 30 | Fill #1

## 2018-12-12 MED FILL — PREMARIN 1.25 MG TABLET: 1.25 | 30 days supply | Qty: 30 | Fill #2

## 2018-12-21 MED FILL — SYNTHROID 137 MCG TABLET: 137 | 90 days supply | Qty: 90 | Fill #0

## 2019-01-15 MED FILL — PREMARIN 1.25 MG TABLET: 1.25 | 30 days supply | Qty: 30 | Fill #3

## 2019-01-23 MED FILL — PROGESTERONE 100 MG CAPSULE: 100 | 90 days supply | Qty: 90 | Fill #2

## 2019-02-08 DIAGNOSIS — Z1231 Encounter for screening mammogram for malignant neoplasm of breast: Secondary | ICD-10-CM | POA: Diagnosis not present

## 2019-02-08 DIAGNOSIS — Z13820 Encounter for screening for osteoporosis: Secondary | ICD-10-CM | POA: Diagnosis not present

## 2019-02-08 DIAGNOSIS — Z01419 Encounter for gynecological examination (general) (routine) without abnormal findings: Secondary | ICD-10-CM | POA: Diagnosis not present

## 2019-02-08 DIAGNOSIS — Z6826 Body mass index (BMI) 26.0-26.9, adult: Secondary | ICD-10-CM | POA: Diagnosis not present

## 2019-02-08 MED FILL — FLUCONAZOLE 150 MG TABLET: 150 | 9 days supply | Qty: 3 | Fill #0

## 2019-02-15 MED FILL — PREMARIN 1.25 MG TABLET: 1.25 | 90 days supply | Qty: 90 | Fill #0

## 2019-03-29 MED FILL — SYNTHROID 137 MCG TABLET: 137 | 90 days supply | Qty: 90 | Fill #1

## 2019-04-24 MED FILL — PROGESTERONE 100 MG CAPSULE: 100 | 90 days supply | Qty: 90 | Fill #0

## 2019-04-25 DIAGNOSIS — R3121 Asymptomatic microscopic hematuria: Secondary | ICD-10-CM | POA: Diagnosis not present

## 2019-04-25 DIAGNOSIS — Z6837 Body mass index (BMI) 37.0-37.9, adult: Secondary | ICD-10-CM | POA: Diagnosis not present

## 2019-04-25 DIAGNOSIS — E039 Hypothyroidism, unspecified: Secondary | ICD-10-CM | POA: Diagnosis not present

## 2019-05-17 MED FILL — PREMARIN 1.25 MG TABLET: 1.25 | 90 days supply | Qty: 90 | Fill #1

## 2019-07-09 MED FILL — SYNTHROID 137 MCG TABLET: 137 | 90 days supply | Qty: 90 | Fill #0

## 2019-07-27 MED FILL — PROGESTERONE 100 MG CAPSULE: 100 | 90 days supply | Qty: 90 | Fill #1

## 2019-08-15 MED FILL — PREMARIN 1.25 MG TABLET: 1.25 | 90 days supply | Qty: 90 | Fill #2

## 2019-09-06 ENCOUNTER — Ambulatory Visit (INDEPENDENT_AMBULATORY_CARE_PROVIDER_SITE_OTHER): Payer: 59

## 2019-09-06 ENCOUNTER — Ambulatory Visit: Payer: 59 | Admitting: Orthopedic Surgery

## 2019-09-06 ENCOUNTER — Other Ambulatory Visit: Payer: Self-pay

## 2019-09-06 DIAGNOSIS — M79601 Pain in right arm: Secondary | ICD-10-CM

## 2019-09-06 DIAGNOSIS — G5601 Carpal tunnel syndrome, right upper limb: Secondary | ICD-10-CM | POA: Diagnosis not present

## 2019-09-08 ENCOUNTER — Encounter: Payer: Self-pay | Admitting: Orthopedic Surgery

## 2019-09-08 NOTE — Progress Notes (Signed)
Office Visit Note   Patient: Courtney Rose           Date of Birth: 1970/09/01           MRN: [id number] Visit Date: 09/06/2019 Requested by: Cyndi Bender, PA-C [address] [place],  [place] [address] PCP: Cyndi Bender, PA-C  Subjective: Chief Complaint  Patient presents with  . 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  Describes right shoulder pain off and on for a year.  She fell in January on an extended arm when she had groceries.  There is no dislocation of the right shoulder but it was a superior force.  Her sure to pick things up.  Also describes tingling in digits 2 3 and 4 on the palmar side which is waking her up about 4 nights out of 7.  She does report some mild loss of dexterity in the right hand during the day as well.  This has been going on for 2 months.  Denies any neck pain.  She has not tried a night splint yet.  She does report some new popping and grinding in the shoulder.  Usually takes Aleve for her symptoms.              ROS: All systems reviewed are negative as they relate to the chief complaint within the history of present illness.  Patient denies  fevers or chills.   Assessment & Plan: Visit Diagnoses:  1. Right arm pain     Plan: Impression is right shoulder pain with possible labral pathology present based on mechanism of injury and some grinding and clicking on exam today.  No real rotator cuff weakness on exam.  Does not look like a frozen shoulder.  Need MRI arthrogram of the right shoulder to evaluate the labrum.  Regarding the numbness and tingling in digits 2 3 and 4 I think this does look like carpal tunnel syndrome which is likely moderate to moderately severe.  Wrist splint for nighttime use is provided.  This is not practical for her to use during the day.  Need nerve conduction study to evaluate carpal tunnel syndrome in that right wrist.  Follow-up after both the  studies.  Follow-Up Instructions: No follow-ups on file.   Orders:  Orders Placed This Encounter  Procedures  . XR Shoulder Right  . XR Cervical Spine 2 or 3 views  . Arthrogram  . MR SHOULDER RIGHT W CONTRAST  . Ambulatory referral to Physical Medicine Rehab   No orders of the defined types were placed in this encounter.     Procedures: No procedures performed   Clinical Data: No additional findings.  Objective: Vital Signs: There were no vitals taken for this visit.  Physical Exam:   Constitutional: Patient appears well-developed HEENT:  Head: Normocephalic Eyes:EOM are normal Neck: Normal range of motion Cardiovascular: Normal rate Pulmonary/chest: Effort normal Neurologic: Patient is alert Skin: Skin is warm Psychiatric: Patient has normal mood and affect    Ortho Exam: Ortho exam demonstrates full active and passive range of motion of the cervical spine with no reproduction of symptoms with rotation of the head to the right or left.  Motor sensory function to both hands intact.  Radial pulse intact bilaterally.  Patient has no abductor pollicis brevis wasting and good strength in both thumbs.  Mild tenderness over the carpal tunnel itself on the right.  Elbow wrist and shoulder have full range of motion including external rotation on the right and left shoulder to about 75 degrees.  Rotator cuff strength is excellent infraspinatus supraspinatus and subscap muscle testing.  No limitation of passive forward flexion on the right left-hand side.  O'Brien's testing positive on the right negative on the left.  Does have a little bit of coarseness and grinding with internal/external rotation of that right arm at 90 degrees of abduction.  No discrete tenderness to the [facility] joint on the right or left hand side no pain with crossarm adduction.  Negative Tinel's cubital tunnel at the elbow with no subluxation of the ulnar nerve at the elbow.  Specialty Comments:  No specialty  comments available.  Imaging: No results found.   PMFS History: There are no problems to display for this patient.  Past Medical History:  Diagnosis Date  . GERD (gastroesophageal reflux disease)   . Hypothyroidism     History reviewed. No pertinent family history.  Past Surgical History:  Procedure Laterality Date  . CESAREAN SECTION    . CHOLECYSTECTOMY N/A 12/08/2017   Procedure: LAPAROSCOPIC CHOLECYSTECTOMY ERAS PATHWAY;  Surgeon: Rolm Bookbinder, MD;  Location: [facility] [place];  Service: General;  Laterality: N/A;   Social History   Occupational History  . Not on file  Tobacco Use  . Smoking status: Never Smoker  . Smokeless tobacco: Never Used  Substance and Sexual Activity  . Alcohol use: Never  . Drug use: Never  . Sexual activity: Not on file

## 2019-10-04 ENCOUNTER — Other Ambulatory Visit: Payer: Self-pay

## 2019-10-04 ENCOUNTER — Ambulatory Visit (INDEPENDENT_AMBULATORY_CARE_PROVIDER_SITE_OTHER): Payer: 59 | Admitting: Physical Medicine and Rehabilitation

## 2019-10-04 ENCOUNTER — Encounter: Payer: Self-pay | Admitting: Physical Medicine and Rehabilitation

## 2019-10-04 DIAGNOSIS — R202 Paresthesia of skin: Secondary | ICD-10-CM | POA: Diagnosis not present

## 2019-10-04 NOTE — Progress Notes (Signed)
Pt is here for a ncs and states pain in the right shoulder and numbness in the right arm and right hand (middle and ring finger). Pt states symptoms started a few months ago. Lifting and cleaning makes it worse. Aleve helps with symptoms. Right hand dominant. Pt hands are washed.   .Numeric Pain Rating Scale and Functional Assessment Average Pain 6   In the last MONTH (on 0-10 scale) has pain interfered with the following?  1. General activity like being  able to carry out your everyday physical activities such as walking, climbing stairs, carrying groceries, or moving a chair?  Rating(7)   +Driver, -BT, -Dye Allergies.

## 2019-10-05 ENCOUNTER — Encounter: Payer: Self-pay | Admitting: Physical Medicine and Rehabilitation

## 2019-10-05 NOTE — Progress Notes (Signed)
Courtney Rose - 49 y.o. female MRN [id number]  Date of birth: 24-Apr-1971  Office Visit Note: Visit Date: 10/04/2019 PCP: Cyndi Bender, PA-C Referred by: Cyndi Bender, PA-C  Subjective: Chief Complaint  Patient presents with  . Right Shoulder - Pain  . Right Arm - Numbness  . Right Hand - Numbness   HPI: Courtney Rose is a 49 y.o. female who comes in today At the request of Dr. Anderson Malta for electrodiagnostic study of the right upper limb.  Patient is right-hand dominant with several months of chronic worsening right shoulder pain with pain numbness and tingling in the right arm and hand.  The tingling is predominantly in the middle and ring finger.  If this were cervical it would be more of a C7 distribution.  She reports worsening with lifting and cleaning.  She gets a lot of achiness into the thumb in particular.  She is scheduled for MRI arthrogram of the right shoulder.  She denies any frank neck pain.  Her average pain is 6 out of 10.  She has had no prior electrodiagnostic studies.  She has no symptoms on the left.  She is not a diabetic.  ROS Otherwise per HPI.  Assessment & Plan: Visit Diagnoses:  1. Paresthesia of skin     Plan: Impression: The above electrodiagnostic study is ABNORMAL and reveals evidence of a mild right median nerve entrapment at the wrist (carpal tunnel syndrome) affecting sensory components.   There is no significant electrodiagnostic evidence of any other focal nerve entrapment, brachial plexopathy or cervical radiculopathy.   ** As you know, this particular electrodiagnostic study cannot rule out chemical radiculitis or sensory only radiculopathy.  Recommendations: 1.  Follow-up with referring physician. 2.  Continue current management of symptoms.  Consider diagnostic carpal tunnel injection.  Meds & Orders: No orders of the defined types were placed in this encounter.   Orders Placed This Encounter  Procedures  . NCV with EMG  (electromyography)    Follow-up: Return for  Anderson Malta, M.D..   Procedures: No procedures performed  EMG & NCV Findings: Evaluation of the right median motor nerve showed reduced amplitude (3.2 mV).  The right median (across palm) sensory nerve showed prolonged distal peak latency (Wrist, 4.3 ms).  All remaining nerves (as indicated in the following tables) were within normal limits.    All examined muscles (as indicated in the following table) showed no evidence of electrical instability.    Impression: The above electrodiagnostic study is ABNORMAL and reveals evidence of a mild right median nerve entrapment at the wrist (carpal tunnel syndrome) affecting sensory components.   There is no significant electrodiagnostic evidence of any other focal nerve entrapment, brachial plexopathy or cervical radiculopathy.   ** As you know, this particular electrodiagnostic study cannot rule out chemical radiculitis or sensory only radiculopathy.  Recommendations: 1.  Follow-up with referring physician. 2.  Continue current management of symptoms.  Consider diagnostic carpal tunnel injection.  ___________________________ Laurence Spates FAAPMR Board Certified, American Board of Physical Medicine and Rehabilitation    Nerve Conduction Studies Anti Sensory Summary Table   Stim Site NR Peak (ms) Norm Peak (ms) P-T Amp (V) Norm P-T Amp Site1 Site2 Delta-P (ms) Dist (cm) Vel (m/s) Norm Vel (m/s)  Right Median Acr Palm Anti Sensory (2nd Digit)  30.3C  Wrist    *4.3 <3.6 37.7 >10 Wrist Palm 2.3 0.0    Palm    2.0 <2.0 44.1  Right Radial Anti Sensory (Base 1st Digit)  30.6C  Wrist    2.0 <3.1 24.8  Wrist Base 1st Digit 2.0 0.0    Right Ulnar Anti Sensory (5th Digit)  30.7C  Wrist    3.2 <3.7 42.1 >15.0 Wrist 5th Digit 3.2 14.0 44 >38   Motor Summary Table   Stim Site NR Onset (ms) Norm Onset (ms) O-P Amp (mV) Norm O-P Amp Site1 Site2 Delta-0 (ms) Dist (cm) Vel (m/s) Norm Vel (m/s)    Right Median Motor (Abd Poll Brev)  30.7C  Wrist    4.0 <4.2 *3.2 >5 Elbow Wrist 4.0 21.5 54 >50  Elbow    8.0  2.4         Right Ulnar Motor (Abd Dig Min)  30.8C  Wrist    3.3 <4.2 13.2 >3 B Elbow Wrist 3.0 20.0 67 >53  B Elbow    6.3  13.6  A Elbow B Elbow 1.4 10.0 71 >53  A Elbow    7.7  13.4          EMG   Side Muscle Nerve Root Ins Act Fibs Psw Amp Dur Poly Recrt Int Fraser Din Comment  Right Abd Poll Brev Median C8-T1 Nml Nml Nml Nml Nml 0 Nml Nml   Right 1stDorInt Ulnar C8-T1 Nml Nml Nml Nml Nml 0 Nml Nml   Right PronatorTeres Median C6-7 Nml Nml Nml Nml Nml 0 Nml Nml   Right Biceps Musculocut C5-6 Nml Nml Nml Nml Nml 0 Nml Nml   Right Deltoid Axillary C5-6 Nml Nml Nml Nml Nml 0 Nml Nml     Nerve Conduction Studies Anti Sensory Left/Right Comparison   Stim Site L Lat (ms) R Lat (ms) L-R Lat (ms) L Amp (V) R Amp (V) L-R Amp (%) Site1 Site2 L Vel (m/s) R Vel (m/s) L-R Vel (m/s)  Median Acr Palm Anti Sensory (2nd Digit)  30.3C  Wrist  *4.3   37.7  Wrist Palm     Palm  2.0   44.1        Radial Anti Sensory (Base 1st Digit)  30.6C  Wrist  2.0   24.8  Wrist Base 1st Digit     Ulnar Anti Sensory (5th Digit)  30.7C  Wrist  3.2   42.1  Wrist 5th Digit  44    Motor Left/Right Comparison   Stim Site L Lat (ms) R Lat (ms) L-R Lat (ms) L Amp (mV) R Amp (mV) L-R Amp (%) Site1 Site2 L Vel (m/s) R Vel (m/s) L-R Vel (m/s)  Median Motor (Abd Poll Brev)  30.7C  Wrist  4.0   *3.2  Elbow Wrist  54   Elbow  8.0   2.4        Ulnar Motor (Abd Dig Min)  30.8C  Wrist  3.3   13.2  B Elbow Wrist  67   B Elbow  6.3   13.6  A Elbow B Elbow  71   A Elbow  7.7   13.4           Waveforms:             Clinical History: No specialty comments available.   She reports that she has never smoked. She has never used smokeless tobacco. No results for input(s): HGBA1C, LABURIC in the last 8760 hours.  Objective:  VS:  HT:    WT:   BMI:     BP:   HR: bpm  TEMP: ( )  RESP:  Physical  Exam Musculoskeletal:        General: No swelling, tenderness or deformity.     Comments: Inspection seems to show some flattening of the right APB but with arthritic changes of the [facility] joint but reveals no atrophy of the bilateral FDI or hand intrinsics. There is no swelling, color changes, allodynia or dystrophic changes. There is 5 out of 5 strength in the bilateral wrist extension, finger abduction and long finger flexion. There is intact sensation to light touch in all dermatomal and peripheral nerve distributions.  There is a negative Hoffmann's test bilaterally.  Skin:    General: Skin is warm and dry.     Findings: No erythema or rash.  Neurological:     General: No focal deficit present.     Mental Status: She is alert and oriented to person, place, and time.     Motor: No weakness or abnormal muscle tone.     Coordination: Coordination normal.  Psychiatric:        Mood and Affect: Mood normal.        Behavior: Behavior normal.     Ortho Exam  Imaging: No results found.  Past Medical/Family/Surgical/Social History: Medications & Allergies reviewed per EMR, new medications updated. There are no problems to display for this patient.  Past Medical History:  Diagnosis Date  . GERD (gastroesophageal reflux disease)   . Hypothyroidism    History reviewed. No pertinent family history. Past Surgical History:  Procedure Laterality Date  . CESAREAN SECTION    . CHOLECYSTECTOMY N/A 12/08/2017   Procedure: LAPAROSCOPIC CHOLECYSTECTOMY ERAS PATHWAY;  Surgeon: Rolm Bookbinder, MD;  Location: [place];  Service: General;  Laterality: N/A;   Social History   Occupational History  . Not on file  Tobacco Use  . Smoking status: Never Smoker  . Smokeless tobacco: Never Used  Vaping Use  . Vaping Use: Never used  Substance and Sexual Activity  . Alcohol use: Never  . Drug use: Never  . Sexual activity: Not on file

## 2019-10-08 NOTE — Procedures (Signed)
EMG & NCV Findings: Evaluation of the right median motor nerve showed reduced amplitude (3.2 mV).  The right median (across palm) sensory nerve showed prolonged distal peak latency (Wrist, 4.3 ms).  All remaining nerves (as indicated in the following tables) were within normal limits.    All examined muscles (as indicated in the following table) showed no evidence of electrical instability.    Impression: The above electrodiagnostic study is ABNORMAL and reveals evidence of a mild right median nerve entrapment at the wrist (carpal tunnel syndrome) affecting sensory components.   There is no significant electrodiagnostic evidence of any other focal nerve entrapment, brachial plexopathy or cervical radiculopathy.   ** As you know, this particular electrodiagnostic study cannot rule out chemical radiculitis or sensory only radiculopathy.  Recommendations: 1.  Follow-up with referring physician. 2.  Continue current management of symptoms.  Consider diagnostic carpal tunnel injection.  ___________________________ Laurence Spates FAAPMR Board Certified, American Board of Physical Medicine and Rehabilitation    Nerve Conduction Studies Anti Sensory Summary Table   Stim Site NR Peak (ms) Norm Peak (ms) P-T Amp (V) Norm P-T Amp Site1 Site2 Delta-P (ms) Dist (cm) Vel (m/s) Norm Vel (m/s)  Right Median Acr Palm Anti Sensory (2nd Digit)  30.3C  Wrist    *4.3 <3.6 37.7 >10 Wrist Palm 2.3 0.0    Palm    2.0 <2.0 44.1         Right Radial Anti Sensory (Base 1st Digit)  30.6C  Wrist    2.0 <3.1 24.8  Wrist Base 1st Digit 2.0 0.0    Right Ulnar Anti Sensory (5th Digit)  30.7C  Wrist    3.2 <3.7 42.1 >15.0 Wrist 5th Digit 3.2 14.0 44 >38   Motor Summary Table   Stim Site NR Onset (ms) Norm Onset (ms) O-P Amp (mV) Norm O-P Amp Site1 Site2 Delta-0 (ms) Dist (cm) Vel (m/s) Norm Vel (m/s)  Right Median Motor (Abd Poll Brev)  30.7C  Wrist    4.0 <4.2 *3.2 >5 Elbow Wrist 4.0 21.5 54 >50  Elbow     8.0  2.4         Right Ulnar Motor (Abd Dig Min)  30.8C  Wrist    3.3 <4.2 13.2 >3 B Elbow Wrist 3.0 20.0 67 >53  B Elbow    6.3  13.6  A Elbow B Elbow 1.4 10.0 71 >53  A Elbow    7.7  13.4          EMG   Side Muscle Nerve Root Ins Act Fibs Psw Amp Dur Poly Recrt Int Fraser Din Comment  Right Abd Poll Brev Median C8-T1 Nml Nml Nml Nml Nml 0 Nml Nml   Right 1stDorInt Ulnar C8-T1 Nml Nml Nml Nml Nml 0 Nml Nml   Right PronatorTeres Median C6-7 Nml Nml Nml Nml Nml 0 Nml Nml   Right Biceps Musculocut C5-6 Nml Nml Nml Nml Nml 0 Nml Nml   Right Deltoid Axillary C5-6 Nml Nml Nml Nml Nml 0 Nml Nml     Nerve Conduction Studies Anti Sensory Left/Right Comparison   Stim Site L Lat (ms) R Lat (ms) L-R Lat (ms) L Amp (V) R Amp (V) L-R Amp (%) Site1 Site2 L Vel (m/s) R Vel (m/s) L-R Vel (m/s)  Median Acr Palm Anti Sensory (2nd Digit)  30.3C  Wrist  *4.3   37.7  Wrist Palm     Palm  2.0   44.1        Radial  Anti Sensory (Base 1st Digit)  30.6C  Wrist  2.0   24.8  Wrist Base 1st Digit     Ulnar Anti Sensory (5th Digit)  30.7C  Wrist  3.2   42.1  Wrist 5th Digit  44    Motor Left/Right Comparison   Stim Site L Lat (ms) R Lat (ms) L-R Lat (ms) L Amp (mV) R Amp (mV) L-R Amp (%) Site1 Site2 L Vel (m/s) R Vel (m/s) L-R Vel (m/s)  Median Motor (Abd Poll Brev)  30.7C  Wrist  4.0   *3.2  Elbow Wrist  54   Elbow  8.0   2.4        Ulnar Motor (Abd Dig Min)  30.8C  Wrist  3.3   13.2  B Elbow Wrist  67   B Elbow  6.3   13.6  A Elbow B Elbow  71   A Elbow  7.7   13.4           Waveforms:

## 2019-10-11 MED FILL — SYNTHROID 137 MCG TABLET: 137 | 90 days supply | Qty: 90 | Fill #1

## 2019-10-15 ENCOUNTER — Ambulatory Visit (HOSPITAL_COMMUNITY)
Admission: RE | Admit: 2019-10-15 | Discharge: 2019-10-15 | Disposition: A | Discharge status: Home / Self Care | Payer: 59 | Source: Ambulatory Visit | Attending: Orthopedic Surgery | Admitting: Orthopedic Surgery

## 2019-10-15 ENCOUNTER — Other Ambulatory Visit: Payer: Self-pay

## 2019-10-15 ENCOUNTER — Encounter (HOSPITAL_COMMUNITY): Payer: Self-pay | Admitting: *Deleted

## 2019-10-15 DIAGNOSIS — M25511 Pain in right shoulder: Secondary | ICD-10-CM | POA: Diagnosis not present

## 2019-10-15 DIAGNOSIS — S46011A Strain of muscle(s) and tendon(s) of the rotator cuff of right shoulder, initial encounter: Secondary | ICD-10-CM | POA: Diagnosis not present

## 2019-10-15 DIAGNOSIS — M19011 Primary osteoarthritis, right shoulder: Secondary | ICD-10-CM | POA: Insufficient documentation

## 2019-10-15 DIAGNOSIS — M75111 Incomplete rotator cuff tear or rupture of right shoulder, not specified as traumatic: Secondary | ICD-10-CM | POA: Insufficient documentation

## 2019-10-15 DIAGNOSIS — M79601 Pain in right arm: Secondary | ICD-10-CM

## 2019-10-15 MED ORDER — GADOBENATE DIMEGLUMINE 529 MG/ML IV SOLN
5.0000 mL | Freq: Once | INTRAVENOUS | Status: AC | PRN
  Administered 2019-10-15: 0.1 mL via INTRA_ARTICULAR

## 2019-10-15 MED ORDER — IOHEXOL 180 MG/ML  SOLN
20.0000 mL | Freq: Once | INTRAMUSCULAR | Status: AC | PRN
  Administered 2019-10-15: 10 mL via INTRA_ARTICULAR

## 2019-10-15 MED ORDER — SODIUM CHLORIDE (PF) 0.9 % IJ SOLN
10.0000 mL | Freq: Once | INTRAMUSCULAR | Status: AC
  Administered 2019-10-15: 10 mL via INTRAVENOUS

## 2019-10-15 MED ORDER — LIDOCAINE HCL (PF) 1 % IJ SOLN
5.0000 mL | Freq: Once | INTRAMUSCULAR | Status: AC
  Administered 2019-10-15: 5 mL via INTRADERMAL

## 2019-10-25 ENCOUNTER — Ambulatory Visit: Payer: 59

## 2019-10-25 ENCOUNTER — Ambulatory Visit: Payer: 59 | Admitting: Orthopedic Surgery

## 2019-10-25 DIAGNOSIS — G5601 Carpal tunnel syndrome, right upper limb: Secondary | ICD-10-CM | POA: Diagnosis not present

## 2019-10-25 MED FILL — PROGESTERONE 100 MG CAPSULE: 100 | 90 days supply | Qty: 90 | Fill #2

## 2019-10-25 NOTE — Progress Notes (Signed)
Subjective: She has right carpal tunnel syndrome and is here for ultrasound-guided injection.  Procedure: Ultrasound-guided right carpal tunnel injection: After sterile prep with Betadine, injected 1 cc 1% lidocaine without epinephrine and 20 mg methylprednisolone using a Hydro dissection technique around the median nerve.  She tolerated this without any immediate complications.  She will follow-up as directed.Courtney Rose

## 2019-10-27 ENCOUNTER — Encounter: Payer: Self-pay | Admitting: Orthopedic Surgery

## 2019-10-27 NOTE — Progress Notes (Signed)
Office Visit Note   Patient: Courtney Rose           Date of Birth: [birth year]           MRN: [id number] Visit Date: 10/25/2019 Requested by: Cyndi Bender, PA-C [address] [place],  [place] [address] PCP: Cyndi Bender, PA-C  Subjective: Chief Complaint  Patient presents with  . Follow-up    MRI review    HPI: Courtney Rose presents for MRI results on her right shoulder.  That showed partial-thickness rotator cuff tear without evidence of frozen shoulder or labral pathology.  Scan is reviewed.  Patient also has mild carpal tunnel syndrome by EMG nerve study.  This is giving her more trouble particularly at night.  The splint has not been helpful.  This does not bother her every night.              ROS: All systems reviewed are negative as they relate to the chief complaint within the history of present illness.  Patient denies  fevers or chills.   Assessment & Plan: Visit Diagnoses:  1. Carpal tunnel syndrome, right upper limb     Plan: Impression is mild carpal tunnel syndrome by EMG nerve study but moderate to moderately severe carpal tunnel syndrome by clinical findings.  Plan is ultrasound-guided injection into the carpal canal for pain relief.  We will see how that works.  Regarding the right shoulder nothing is really operative in the shoulder at this time.  I would favor observation for the shoulder.  Could consider subacromial injection as well.  Main thing is I do not want Courtney Rose to let the shoulder started getting stiff in terms of getting a frozen shoulder.  Follow-Up Instructions: Return if symptoms worsen or fail to improve.   Orders:  Orders Placed This Encounter  Procedures  . US Guided Needle Placement   No orders of the defined types were placed in this encounter.     Procedures: No procedures performed   Clinical Data: No additional findings.  Objective: Vital Signs: There were no vitals taken for this visit.  Physical Exam:   Constitutional: Patient  appears well-developed HEENT:  Head: Normocephalic Eyes:EOM are normal Neck: Normal range of motion Cardiovascular: Normal rate Pulmonary/chest: Effort normal Neurologic: Patient is alert Skin: Skin is warm Psychiatric: Patient has normal mood and affect    Ortho Exam: Ortho exam demonstrates good cervical spine range of motion.  Right shoulder range of motion is full passively with symmetric forward flexion abduction and external rotation.  Strength is also good to infraspinatus supraspinatus and subscap muscle testing.  No restriction of passive external rotation of 15 degrees of abduction.  Positive impingement signs right negative on the left.  No discrete AC joint tenderness on the right-hand side.  Negative Tinel's cubital tunnel in the right elbow.  Mildly positive carpal tunnel compression testing on that right wrist.  Grip strength symmetric bilaterally.  Specialty Comments:  No specialty comments available.  Imaging: No results found.   PMFS History: There are no problems to display for this patient.  Past Medical History:  Diagnosis Date  . GERD (gastroesophageal reflux disease)   . Hypothyroidism     History reviewed. No pertinent family history.  Past Surgical History:  Procedure Laterality Date  . CESAREAN SECTION    . CHOLECYSTECTOMY N/A 12/08/2017   Procedure: LAPAROSCOPIC CHOLECYSTECTOMY ERAS PATHWAY;  Surgeon: Rolm Bookbinder, MD;  Location: [place];  Service: General;  Laterality: N/A;   Social History  Occupational History  . Not on file  Tobacco Use  . Smoking status: Never Smoker  . Smokeless tobacco: Never Used  Vaping Use  . Vaping Use: Never used  Substance and Sexual Activity  . Alcohol use: Never  . Drug use: Never  . Sexual activity: Not on file

## 2019-10-31 DIAGNOSIS — E559 Vitamin D deficiency, unspecified: Secondary | ICD-10-CM | POA: Diagnosis not present

## 2019-10-31 DIAGNOSIS — E039 Hypothyroidism, unspecified: Secondary | ICD-10-CM | POA: Diagnosis not present

## 2019-10-31 DIAGNOSIS — Z6827 Body mass index (BMI) 27.0-27.9, adult: Secondary | ICD-10-CM | POA: Diagnosis not present

## 2019-10-31 DIAGNOSIS — Z0000 Encounter for general adult medical examination without abnormal findings: Secondary | ICD-10-CM | POA: Diagnosis not present

## 2019-11-01 ENCOUNTER — Other Ambulatory Visit (HOSPITAL_COMMUNITY): Payer: Self-pay | Admitting: Nurse Practitioner

## 2019-11-01 MED FILL — SYNTHROID 125 MCG TABLET: 125 | 90 days supply | Qty: 90 | Fill #0

## 2019-11-02 ENCOUNTER — Encounter: Payer: 59 | Admitting: Physical Medicine and Rehabilitation

## 2019-11-16 MED FILL — PREMARIN 1.25 MG TABLET: 1.25 | 90 days supply | Qty: 90 | Fill #3

## 2019-12-26 DIAGNOSIS — Z6827 Body mass index (BMI) 27.0-27.9, adult: Secondary | ICD-10-CM | POA: Diagnosis not present

## 2019-12-26 DIAGNOSIS — R5383 Other fatigue: Secondary | ICD-10-CM | POA: Diagnosis not present

## 2019-12-26 DIAGNOSIS — R002 Palpitations: Secondary | ICD-10-CM | POA: Diagnosis not present

## 2019-12-26 DIAGNOSIS — E039 Hypothyroidism, unspecified: Secondary | ICD-10-CM | POA: Diagnosis not present

## 2020-01-10 ENCOUNTER — Telehealth: Payer: Self-pay | Admitting: *Deleted

## 2020-01-10 ENCOUNTER — Other Ambulatory Visit: Payer: Self-pay | Admitting: *Deleted

## 2020-01-10 DIAGNOSIS — R002 Palpitations: Secondary | ICD-10-CM

## 2020-01-10 NOTE — Telephone Encounter (Signed)
Patient enrolled for [employer] to deliver a 3 day ZIO XT long term holter monitor to her home on Tuesday, 01/15/2020.  Briefly reviewed monitor instructions.

## 2020-01-14 ENCOUNTER — Ambulatory Visit (INDEPENDENT_AMBULATORY_CARE_PROVIDER_SITE_OTHER): Payer: 59

## 2020-01-14 DIAGNOSIS — R002 Palpitations: Secondary | ICD-10-CM | POA: Diagnosis not present

## 2020-01-22 DIAGNOSIS — R002 Palpitations: Secondary | ICD-10-CM | POA: Diagnosis not present

## 2020-01-22 DIAGNOSIS — I493 Ventricular premature depolarization: Secondary | ICD-10-CM | POA: Diagnosis not present

## 2020-01-31 MED FILL — PREMARIN 1.25 MG TABLET: 1.25 | 90 days supply | Qty: 90 | Fill #4

## 2020-01-31 MED FILL — PROGESTERONE 100 MG CAPSULE: 100 | 90 days supply | Qty: 90 | Fill #3

## 2020-02-12 ENCOUNTER — Telehealth: Payer: Self-pay | Admitting: *Deleted

## 2020-02-12 NOTE — Telephone Encounter (Signed)
Courtney Rose is calling stating the office that requested her holter monitor never received the results. Please advise.

## 2020-02-13 NOTE — Telephone Encounter (Signed)
Explained to patient results should have been faxed automatically to Dr. Lisbeth Ply when test was marked final by our cardiologist.   I reset the CC/ fax to [phone], called [facility] at [phone] to confirm fax was received.  They will deliver results to Dr. Lisbeth Ply. Apologized for delay of test results.

## 2020-02-20 ENCOUNTER — Other Ambulatory Visit (HOSPITAL_COMMUNITY): Payer: Self-pay | Admitting: Obstetrics & Gynecology

## 2020-02-20 DIAGNOSIS — Z6827 Body mass index (BMI) 27.0-27.9, adult: Secondary | ICD-10-CM | POA: Diagnosis not present

## 2020-02-20 DIAGNOSIS — Z01419 Encounter for gynecological examination (general) (routine) without abnormal findings: Secondary | ICD-10-CM | POA: Diagnosis not present

## 2020-02-20 DIAGNOSIS — Z1231 Encounter for screening mammogram for malignant neoplasm of breast: Secondary | ICD-10-CM | POA: Diagnosis not present

## 2020-04-24 MED FILL — SYNTHROID 125 MCG TABLET: 125 | 90 days supply | Qty: 90 | Fill #1

## 2020-04-24 MED FILL — PROGESTERONE 100 MG CAPSULE: 100 | 90 days supply | Qty: 90 | Fill #0

## 2020-04-24 MED FILL — PREMARIN 1.25 MG TABLET: 1.25 | 90 days supply | Qty: 90 | Fill #0

## 2020-05-15 DIAGNOSIS — Z6828 Body mass index (BMI) 28.0-28.9, adult: Secondary | ICD-10-CM | POA: Diagnosis not present

## 2020-05-15 DIAGNOSIS — R3121 Asymptomatic microscopic hematuria: Secondary | ICD-10-CM | POA: Diagnosis not present

## 2020-05-15 DIAGNOSIS — R5383 Other fatigue: Secondary | ICD-10-CM | POA: Diagnosis not present

## 2020-05-15 DIAGNOSIS — E039 Hypothyroidism, unspecified: Secondary | ICD-10-CM | POA: Diagnosis not present

## 2020-07-25 DIAGNOSIS — H524 Presbyopia: Secondary | ICD-10-CM | POA: Diagnosis not present

## 2020-07-25 DIAGNOSIS — H52223 Regular astigmatism, bilateral: Secondary | ICD-10-CM | POA: Diagnosis not present

## 2020-07-25 DIAGNOSIS — H5213 Myopia, bilateral: Secondary | ICD-10-CM | POA: Diagnosis not present

## 2020-07-31 ENCOUNTER — Other Ambulatory Visit (HOSPITAL_COMMUNITY): Payer: Self-pay

## 2020-07-31 MED FILL — Levothyroxine Sodium Tab 125 MCG: ORAL | 90 days supply | Qty: 90 | Fill #0 | Status: AC

## 2020-07-31 MED FILL — Progesterone Cap 100 MG: ORAL | 90 days supply | Qty: 90 | Fill #0 | Status: AC

## 2020-08-21 MED FILL — Estrogens, Conjugated Tab 1.25 MG: ORAL | 90 days supply | Qty: 90 | Fill #0 | Status: AC

## 2020-08-22 ENCOUNTER — Other Ambulatory Visit (HOSPITAL_COMMUNITY): Payer: Self-pay

## 2020-11-02 MED FILL — Progesterone Cap 100 MG: ORAL | 10 days supply | Qty: 10 | Fill #1 | Status: AC

## 2020-11-03 ENCOUNTER — Other Ambulatory Visit (HOSPITAL_COMMUNITY): Payer: Self-pay

## 2020-11-03 MED FILL — Progesterone Cap 100 MG: ORAL | 80 days supply | Qty: 80 | Fill #1 | Status: AC

## 2020-11-11 ENCOUNTER — Other Ambulatory Visit (HOSPITAL_COMMUNITY): Payer: Self-pay

## 2020-11-12 ENCOUNTER — Other Ambulatory Visit (HOSPITAL_COMMUNITY): Payer: Self-pay

## 2020-11-12 MED ORDER — LEVOTHYROXINE SODIUM 125 MCG PO TABS
125.0000 ug | ORAL_TABLET | Freq: Every day | ORAL | 0 refills | Status: DC
  Filled 2020-11-12: qty 90, 90d supply, fill #0

## 2020-11-27 ENCOUNTER — Other Ambulatory Visit (HOSPITAL_COMMUNITY): Payer: Self-pay

## 2020-11-27 MED FILL — Estrogens, Conjugated Tab 1.25 MG: ORAL | 90 days supply | Qty: 90 | Fill #1 | Status: AC

## 2020-11-28 DIAGNOSIS — Z1211 Encounter for screening for malignant neoplasm of colon: Secondary | ICD-10-CM | POA: Diagnosis not present

## 2020-11-28 DIAGNOSIS — E559 Vitamin D deficiency, unspecified: Secondary | ICD-10-CM | POA: Diagnosis not present

## 2020-11-28 DIAGNOSIS — Z0000 Encounter for general adult medical examination without abnormal findings: Secondary | ICD-10-CM | POA: Diagnosis not present

## 2020-11-28 DIAGNOSIS — Z6826 Body mass index (BMI) 26.0-26.9, adult: Secondary | ICD-10-CM | POA: Diagnosis not present

## 2020-11-28 DIAGNOSIS — Z23 Encounter for immunization: Secondary | ICD-10-CM | POA: Diagnosis not present

## 2020-11-28 DIAGNOSIS — E039 Hypothyroidism, unspecified: Secondary | ICD-10-CM | POA: Diagnosis not present

## 2020-12-01 ENCOUNTER — Other Ambulatory Visit (HOSPITAL_BASED_OUTPATIENT_CLINIC_OR_DEPARTMENT_OTHER): Payer: Self-pay

## 2020-12-01 ENCOUNTER — Other Ambulatory Visit (HOSPITAL_COMMUNITY): Payer: Self-pay

## 2020-12-01 MED ORDER — LEVOTHYROXINE SODIUM 112 MCG PO TABS
112.0000 ug | ORAL_TABLET | Freq: Every day | ORAL | 0 refills | Status: DC
  Filled 2020-12-01: qty 60, 60d supply, fill #0

## 2021-01-21 DIAGNOSIS — E039 Hypothyroidism, unspecified: Secondary | ICD-10-CM | POA: Diagnosis not present

## 2021-01-22 ENCOUNTER — Other Ambulatory Visit (HOSPITAL_COMMUNITY): Payer: Self-pay

## 2021-01-22 MED ORDER — LEVOTHYROXINE SODIUM 125 MCG PO TABS
125.0000 ug | ORAL_TABLET | Freq: Every day | ORAL | 0 refills | Status: DC
  Filled 2021-01-22 (×2): qty 60, 60d supply, fill #0
  Filled 2021-03-16: qty 60, 60d supply, fill #1

## 2021-01-27 ENCOUNTER — Other Ambulatory Visit (HOSPITAL_COMMUNITY): Payer: Self-pay

## 2021-01-27 MED FILL — Progesterone Cap 100 MG: ORAL | 90 days supply | Qty: 90 | Fill #2 | Status: AC

## 2021-01-28 IMAGING — RF DG FLUORO GUIDE NDL PLC/BX
5 series · 5 of 5 positions shown · non-contrast
Comparison: none

CLINICAL DATA: Right shoulder pain since falling 1 year ago.
Concern for labral injury.

EXAM:
RIGHT SHOULDER INJECTION UNDER FLUOROSCOPY
TECHNIQUE: Time out procedure was performed. The procedure, risks (including
but not limited to bleeding, infection, organ damage ), benefits,
and alternatives were explained to the patient. Questions regarding
the procedure were encouraged and answered. The patient understands
and consents to the procedure.

[Series 1: cp_standard · 0.18mm/px · 1 of 1 slices shown (1 of 5)]
[im 1/1]
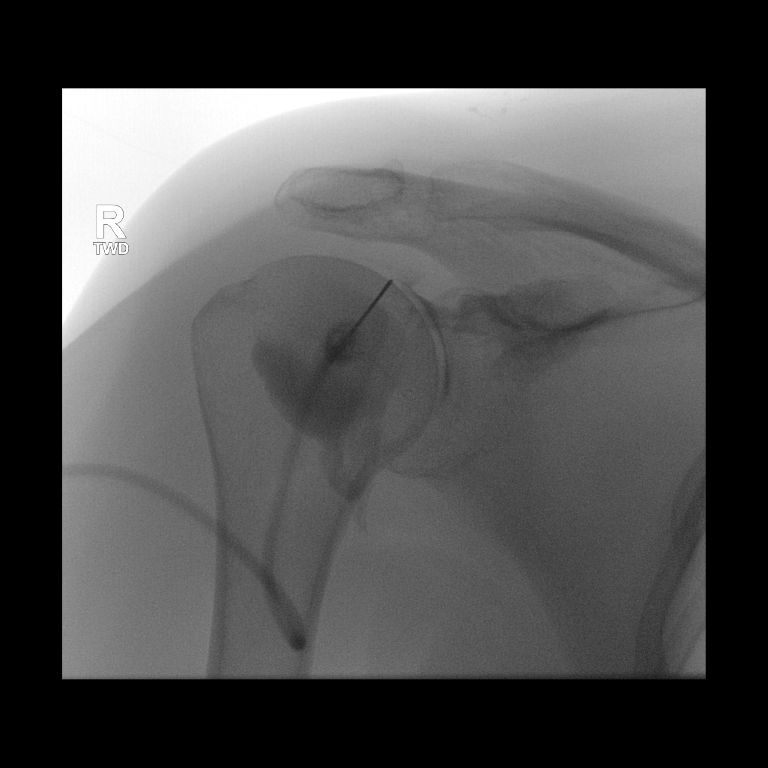

[Series 2: cp_standard · 0.18mm/px · 1 of 1 slices shown (2 of 5)]
[im 1/1]
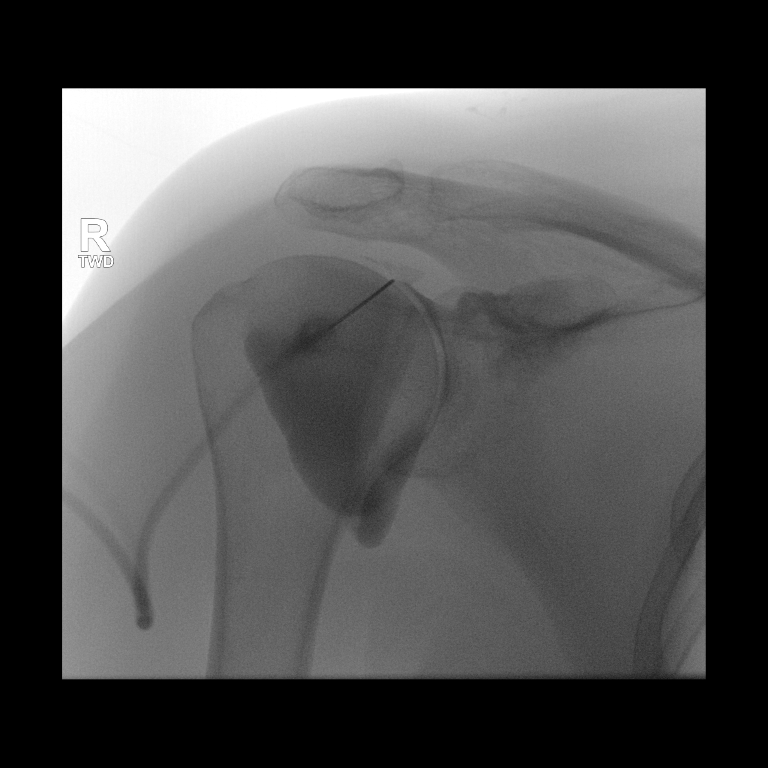

[Series 3: cp_standard · 0.18mm/px · 1 of 1 slices shown (3 of 5)]
[im 1/1]
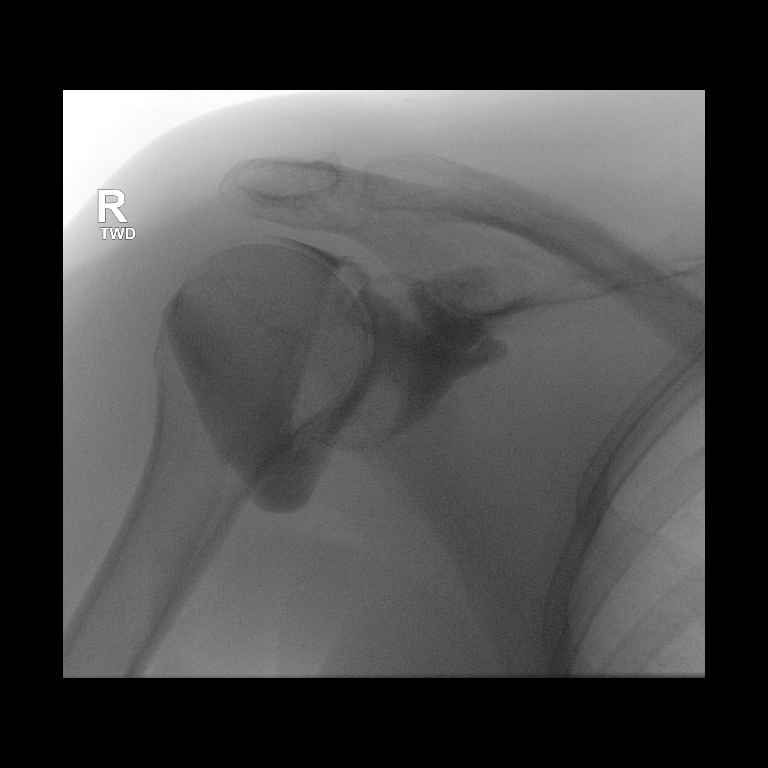

[Series 4: cp_standard · 0.19mm/px · 1 of 1 slices shown (4 of 5)]
[im 1/1]
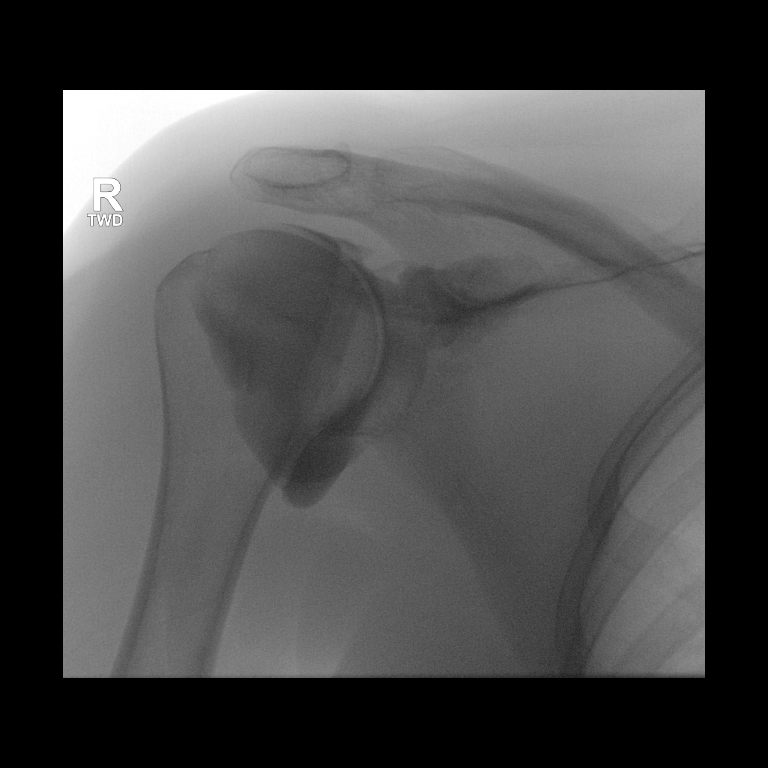

[Series 5: cp_standard · 0.19mm/px · 1 of 1 slices shown (5 of 5)]
[im 1/1]
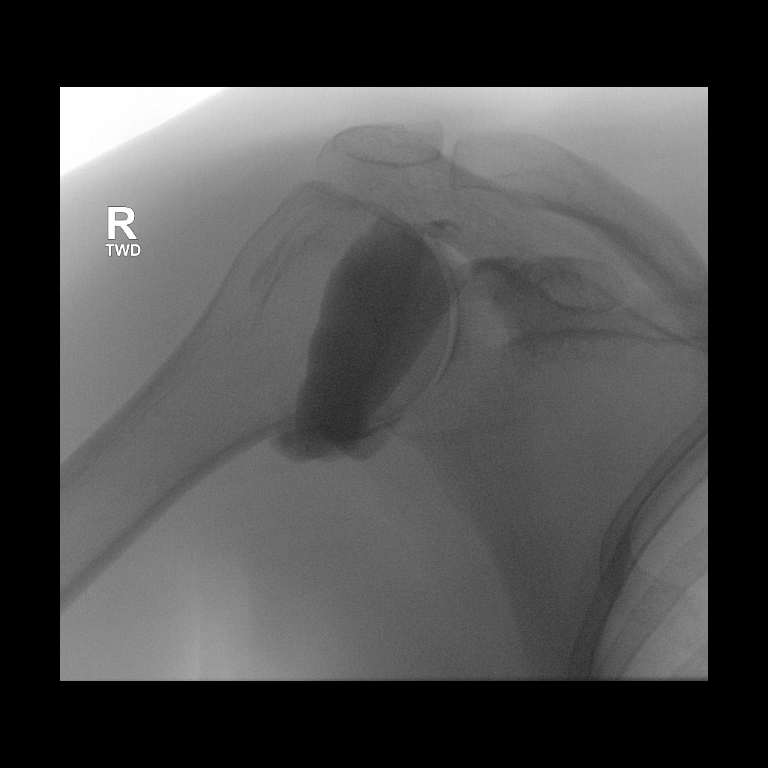

[5 of 5 positions shown; findings below may reference images not displayed]

An appropriate skin entrance site was determined. The site was
marked, prepped with Betadine, draped in the usual sterile fashion,
and infiltrated locally with 2% lidocaine. 22 gauge spinal needle
was advanced to the superomedial margin of the humeral head under
intermittent fluoroscopy. A mixture of 0.05 ml Multihance and 10 ml
of Omnipaque 300 was then used to opacify the right shoulder
capsule. No immediate complication.

FLUOROSCOPY TIME:  Fluoroscopy Time: 24 seconds of low-dose pulsed
fluoroscopy

Radiation Exposure Index (if provided by the fluoroscopic device):
1.1 mGy

Number of Acquired Spot Images: 0
FINDINGS: Spot images demonstrate normal distribution of contrast within the
glenohumeral joint. There is no opacification the
subacromial-subdeltoid bursa.
IMPRESSION: Technically successful right shoulder injection for MRI.

## 2021-02-23 DIAGNOSIS — Z01419 Encounter for gynecological examination (general) (routine) without abnormal findings: Secondary | ICD-10-CM | POA: Diagnosis not present

## 2021-02-23 DIAGNOSIS — Z1231 Encounter for screening mammogram for malignant neoplasm of breast: Secondary | ICD-10-CM | POA: Diagnosis not present

## 2021-02-23 DIAGNOSIS — Z6826 Body mass index (BMI) 26.0-26.9, adult: Secondary | ICD-10-CM | POA: Diagnosis not present

## 2021-02-26 ENCOUNTER — Other Ambulatory Visit (HOSPITAL_COMMUNITY): Payer: Self-pay

## 2021-02-26 MED ORDER — ESTROGENS CONJUGATED 1.25 MG PO TABS
1.2500 mg | ORAL_TABLET | Freq: Every day | ORAL | 99 refills | Status: DC
  Filled 2021-02-26: qty 90, 90d supply, fill #0
  Filled 2021-06-05: qty 90, 90d supply, fill #1
  Filled 2021-09-01: qty 90, 90d supply, fill #2
  Filled 2021-12-02: qty 90, 90d supply, fill #3

## 2021-03-09 DIAGNOSIS — Z1331 Encounter for screening for depression: Secondary | ICD-10-CM | POA: Diagnosis not present

## 2021-03-09 DIAGNOSIS — E559 Vitamin D deficiency, unspecified: Secondary | ICD-10-CM | POA: Diagnosis not present

## 2021-03-09 DIAGNOSIS — E039 Hypothyroidism, unspecified: Secondary | ICD-10-CM | POA: Diagnosis not present

## 2021-03-09 DIAGNOSIS — R5383 Other fatigue: Secondary | ICD-10-CM | POA: Diagnosis not present

## 2021-03-16 ENCOUNTER — Other Ambulatory Visit (HOSPITAL_COMMUNITY): Payer: Self-pay

## 2021-03-16 ENCOUNTER — Encounter: Payer: Self-pay | Admitting: Gastroenterology

## 2021-03-17 ENCOUNTER — Other Ambulatory Visit (HOSPITAL_COMMUNITY): Payer: Self-pay

## 2021-03-17 MED ORDER — LEVOTHYROXINE SODIUM 125 MCG PO TABS
125.0000 ug | ORAL_TABLET | Freq: Every day | ORAL | 1 refills | Status: DC
  Filled 2021-03-17: qty 90, 90d supply, fill #0
  Filled 2021-06-17: qty 90, 90d supply, fill #1

## 2021-03-18 ENCOUNTER — Other Ambulatory Visit (HOSPITAL_COMMUNITY): Payer: Self-pay

## 2021-05-06 ENCOUNTER — Other Ambulatory Visit (HOSPITAL_COMMUNITY): Payer: Self-pay

## 2021-05-06 ENCOUNTER — Ambulatory Visit (AMBULATORY_SURGERY_CENTER): Payer: Self-pay | Admitting: *Deleted

## 2021-05-06 VITALS — Ht 64.0 in | Wt 151.0 lb

## 2021-05-06 DIAGNOSIS — Z1211 Encounter for screening for malignant neoplasm of colon: Secondary | ICD-10-CM

## 2021-05-06 MED ORDER — NA SULFATE-K SULFATE-MG SULF 17.5-3.13-1.6 GM/177ML PO SOLN
2.0000 | Freq: Once | ORAL | 0 refills | Status: AC
  Filled 2021-05-06: qty 354, 1d supply, fill #0

## 2021-05-06 NOTE — Progress Notes (Signed)

## 2021-05-11 ENCOUNTER — Other Ambulatory Visit (HOSPITAL_COMMUNITY): Payer: Self-pay

## 2021-05-11 MED ORDER — PROGESTERONE MICRONIZED 100 MG PO CAPS
100.0000 mg | ORAL_CAPSULE | Freq: Every day | ORAL | 3 refills | Status: AC
  Filled 2021-05-11: qty 90, 90d supply, fill #0
  Filled 2021-08-12: qty 90, 90d supply, fill #1
  Filled 2021-11-10: qty 90, 90d supply, fill #2
  Filled 2022-02-15: qty 90, 90d supply, fill #3

## 2021-05-13 ENCOUNTER — Encounter: Payer: Self-pay | Admitting: Gastroenterology

## 2021-05-20 ENCOUNTER — Ambulatory Visit (AMBULATORY_SURGERY_CENTER): Payer: 59 | Admitting: Gastroenterology

## 2021-05-20 ENCOUNTER — Other Ambulatory Visit: Payer: Self-pay

## 2021-05-20 ENCOUNTER — Encounter: Payer: Self-pay | Admitting: Gastroenterology

## 2021-05-20 VITALS — BP 100/59 | HR 56 | Temp 98.0°F | Resp 16 | Ht 64.0 in | Wt 151.0 lb

## 2021-05-20 DIAGNOSIS — Z1211 Encounter for screening for malignant neoplasm of colon: Secondary | ICD-10-CM | POA: Diagnosis not present

## 2021-05-20 MED ORDER — SODIUM CHLORIDE 0.9 % IV SOLN: 500.0000 mL | Freq: Once | INTRAVENOUS | Status: DC

## 2021-05-20 NOTE — Patient Instructions (Signed)
Resume previous diet and medications. Repeat Colonoscopy in 10 years for screening purposes.  YOU HAD AN ENDOSCOPIC PROCEDURE TODAY AT [facility]:   Refer to the procedure report that was given to you for any specific questions about what was found during the examination.  If the procedure report does not answer your questions, please call your gastroenterologist to clarify.  If you requested that your care partner not be given the details of your procedure findings, then the procedure report has been included in a sealed envelope for you to review at your convenience later.  YOU SHOULD EXPECT: Some feelings of bloating in the abdomen. Passage of more gas than usual.  Walking can help get rid of the air that was put into your GI tract during the procedure and reduce the bloating. If you had a lower endoscopy (such as a colonoscopy or flexible sigmoidoscopy) you may notice spotting of blood in your stool or on the toilet paper. If you underwent a bowel prep for your procedure, you may not have a normal bowel movement for a few days.  Please Note:  You might notice some irritation and congestion in your nose or some drainage.  This is from the oxygen used during your procedure.  There is no need for concern and it should clear up in a day or so.  SYMPTOMS TO REPORT IMMEDIATELY:  Following lower endoscopy (colonoscopy or flexible sigmoidoscopy):  Excessive amounts of blood in the stool  Significant tenderness or worsening of abdominal pains  Swelling of the abdomen that is new, acute  Fever of 100F or higher  For urgent or emergent issues, a gastroenterologist can be reached at any hour by calling [phone]. Do not use MyChart messaging for urgent concerns.    DIET:  We do recommend a small meal at first, but then you may proceed to your regular diet.  Drink plenty of fluids but you should avoid alcoholic beverages for 24 hours.  ACTIVITY:  You should plan to take it easy  for the rest of today and you should NOT DRIVE or use heavy machinery until tomorrow (because of the sedation medicines used during the test).    FOLLOW UP: Our staff will call the number listed on your records 48-72 hours following your procedure to check on you and address any questions or concerns that you may have regarding the information given to you following your procedure. If we do not reach you, we will leave a message.  We will attempt to reach you two times.  During this call, we will ask if you have developed any symptoms of COVID 19. If you develop any symptoms (ie: fever, flu-like symptoms, shortness of breath, cough etc.) before then, please call [phone].  If you test positive for Covid 19 in the 2 weeks post procedure, please call and report this information to [place].    If any biopsies were taken you will be contacted by phone or by letter within the next 1-3 weeks.  Please call us at [phone] if you have not heard about the biopsies in 3 weeks.    SIGNATURES/CONFIDENTIALITY: You and/or your care partner have signed paperwork which will be entered into your electronic medical record.  These signatures attest to the fact that that the information above on your After Visit Summary has been reviewed and is understood.  Full responsibility of the confidentiality of this discharge information lies with you and/or your care-partner.

## 2021-05-20 NOTE — Op Note (Signed)
[place] Patient Name: Courtney Rose Procedure Date: 05/20/2021 8:21 AM MRN: [id number] Endoscopist: Jackquline Denmark , MD Age: 51 Referring MD:  Date of Birth: Sep 18, 1970 Gender: Female Account #: [id number] Procedure:                Colonoscopy Indications:              Screening for colorectal malignant neoplasm Medicines:                Monitored Anesthesia Care Procedure:                Pre-Anesthesia Assessment:                           - Prior to the procedure, a History and Physical                            was performed, and patient medications and                            allergies were reviewed. The patient's tolerance of                            previous anesthesia was also reviewed. The risks                            and benefits of the procedure and the sedation                            options and risks were discussed with the patient.                            All questions were answered, and informed consent                            was obtained. Prior Anticoagulants: The patient has                            taken no previous anticoagulant or antiplatelet                            agents. ASA Grade Assessment: I - A normal, healthy                            patient. After reviewing the risks and benefits,                            the patient was deemed in satisfactory condition to                            undergo the procedure.                           After obtaining informed consent, the colonoscope  was passed under direct vision. Throughout the                            procedure, the patient's blood pressure, pulse, and                            oxygen saturations were monitored continuously. The                            0405 PCF-H190TL Slim SB Colonoscope was introduced                            through the anus and advanced to the 2 cm into the                            ileum. The colonoscopy was  performed without                            difficulty. The patient tolerated the procedure                            well. The quality of the bowel preparation was                            good. The terminal ileum, ileocecal valve,                            appendiceal orifice, and rectum were photographed. Scope In: 8:31:55 AM Scope Out: 8:45:44 AM Scope Withdrawal Time: 0 hours 9 minutes 45 seconds  Total Procedure Duration: 0 hours 13 minutes 49 seconds  Findings:                 The colon (entire examined portion) appeared normal.                           Non-bleeding internal hemorrhoids were found during                            retroflexion. The hemorrhoids were small and Grade                            I (internal hemorrhoids that do not prolapse).                           The terminal ileum appeared normal.                           The exam was otherwise without abnormality on                            direct and retroflexion views. Complications:            No immediate complications. Estimated Blood Loss:     Estimated blood loss: none. Impression:               -  The entire examined colon is normal.                           - Non-bleeding minimal internal hemorrhoids.                           - The examined portion of the ileum was normal.                           - The examination was otherwise normal on direct                            and retroflexion views.                           - No specimens collected. Recommendation:           - Patient has a contact number available for                            emergencies. The signs and symptoms of potential                            delayed complications were discussed with the                            patient. Return to normal activities tomorrow.                            Written discharge instructions were provided to the                            patient.                           - Resume previous  diet.                           - Continue present medications.                           - Repeat colonoscopy in 10 years for screening                            purposes. Earlier, if with any new problems or                            change in family history.                           - The findings and recommendations were discussed                            with the patient's family. Jackquline Denmark, MD 05/20/2021 8:49:41 AM This report has been signed electronically.

## 2021-05-20 NOTE — Progress Notes (Signed)
[facility] History and Physical   Primary Care Physician:  Cyndi Bender, PA-C   Reason for Procedure:   Colorectal cancer screening  Plan:    colonoscopy     HPI: Courtney Rose is a 51 y.o. female   colonoscopy for colorectal cancer screening  occasional diarrhea but not bad after lap chole  no family history  Past Medical History:  Diagnosis Date   GERD (gastroesophageal reflux disease)    Hypothyroidism     Past Surgical History:  Procedure Laterality Date   CESAREAN SECTION     CHOLECYSTECTOMY N/A 12/08/2017   Procedure: [place];  Surgeon: Rolm Bookbinder, MD;  Location: [facility];  Service: General;  Laterality: N/A;   WISDOM TOOTH EXTRACTION      Prior to Admission medications   Medication Sig Start Date End Date Taking? Authorizing Provider  Cholecalciferol (VITAMIN D3) 5000 units CAPS Take 5,000 Units by mouth daily.   Yes [provider]  estrogens, conjugated, (PREMARIN) 1.25 MG tablet Take 1 tablet (1.25 mg total) by mouth daily. 02/23/21  Yes   levothyroxine (SYNTHROID) 125 MCG tablet Take 1 tablet by mouth daily 03/17/21  Yes   progesterone (PROMETRIUM) 100 MG capsule Take 100 mg by mouth at bedtime. 09/01/17  Yes [provider]  vitamin B-12 (CYANOCOBALAMIN) 1000 MCG tablet Take 2,000 mcg by mouth daily.   Yes [provider]  acetaminophen (TYLENOL) 500 MG tablet Take 500 mg by mouth daily as needed for moderate pain or headache.    [provider]  aspirin-acetaminophen-caffeine (EXCEDRIN MIGRAINE) [phone] MG tablet Take 1-2 tablets by mouth daily as needed for headache.    [provider]  naproxen sodium (ALEVE) 220 MG tablet Take 220 mg by mouth daily as needed (pain).    [provider]  PREMARIN 1.25 MG tablet Take 1.25 mg by mouth at bedtime. 11/25/17   [provider]  progesterone (PROMETRIUM) 100 MG capsule Take 1 capsule (100 mg total) by  mouth daily. 02/23/21   Maisie Fus, MD    Current Outpatient Medications  Medication Sig Dispense Refill   Cholecalciferol (VITAMIN D3) 5000 units CAPS Take 5,000 Units by mouth daily.     estrogens, conjugated, (PREMARIN) 1.25 MG tablet Take 1 tablet (1.25 mg total) by mouth daily. 90 tablet PRN   levothyroxine (SYNTHROID) 125 MCG tablet Take 1 tablet by mouth daily 90 tablet 1   progesterone (PROMETRIUM) 100 MG capsule Take 100 mg by mouth at bedtime.  2   vitamin B-12 (CYANOCOBALAMIN) 1000 MCG tablet Take 2,000 mcg by mouth daily.     acetaminophen (TYLENOL) 500 MG tablet Take 500 mg by mouth daily as needed for moderate pain or headache.     aspirin-acetaminophen-caffeine (EXCEDRIN MIGRAINE) 250-250-65 MG tablet Take 1-2 tablets by mouth daily as needed for headache.     naproxen sodium (ALEVE) 220 MG tablet Take 220 mg by mouth daily as needed (pain).     PREMARIN 1.25 MG tablet Take 1.25 mg by mouth at bedtime.  1   progesterone (PROMETRIUM) 100 MG capsule Take 1 capsule (100 mg total) by mouth daily. 90 capsule 3   Current Facility-Administered Medications  Medication Dose Route Frequency Provider Last Rate Last Admin   0.9 %  sodium chloride infusion  500 mL Intravenous Once Jackquline Denmark, MD        Allergies as of 05/20/2021 - Review Complete 05/20/2021  Allergen Reaction Noted   Adhesive [tape]  12/07/2017  Strawberry flavor Swelling 12/07/2017   Sulfa antibiotics Hives 12/07/2017    Family History  Problem Relation Age of Onset   Colon cancer Neg Hx    Colon polyps Neg Hx    Esophageal cancer Neg Hx    Rectal cancer Neg Hx    Stomach cancer Neg Hx     Social History   Socioeconomic History   Marital status: Married    Spouse name: Not on file   Number of children: Not on file   Years of education: Not on file   Highest education level: Not on file  Occupational History   Not on file  Tobacco Use   Smoking status: Never   Smokeless tobacco: Never   Vaping Use   Vaping Use: Never used  Substance and Sexual Activity   Alcohol use: Never   Drug use: Never   Sexual activity: Not on file  Other Topics Concern   Not on file  Social History Narrative   Not on file   Social Determinants of Health   Financial Resource Strain: Not on file  Food Insecurity: Not on file  Transportation Needs: Not on file  Physical Activity: Not on file  Stress: Not on file  Social Connections: Not on file  Intimate Partner Violence: Not on file    Review of Systems: Positive for none All other review of systems negative except as mentioned in the HPI.  Physical Exam: Vital signs in last 24 hours: @VSRANGES @   General:   Alert,  Well-developed, well-nourished, pleasant and cooperative in NAD Lungs:  Clear throughout to auscultation.   Heart:  Regular rate and rhythm; no murmurs, clicks, rubs,  or gallops. Abdomen:  Soft, nontender and nondistended. Normal bowel sounds.   Neuro/Psych:  Alert and cooperative. Normal mood and affect. A and O x 3    No significant changes were identified.  The patient continues to be an appropriate candidate for the planned procedure and anesthesia.   Carmell Austria, MD. [facility] 05/20/2021 8:28 AM@

## 2021-05-20 NOTE — Progress Notes (Signed)
PT taken to PACU. Monitors in place. VSS. Report given to RN. 

## 2021-05-20 NOTE — Progress Notes (Signed)
C.W. vital signs. 

## 2021-05-22 ENCOUNTER — Telehealth: Payer: Self-pay | Admitting: *Deleted

## 2021-05-22 NOTE — Telephone Encounter (Signed)
First attempt, left VM.  

## 2021-06-05 ENCOUNTER — Other Ambulatory Visit (HOSPITAL_COMMUNITY): Payer: Self-pay

## 2021-06-05 ENCOUNTER — Other Ambulatory Visit (HOSPITAL_BASED_OUTPATIENT_CLINIC_OR_DEPARTMENT_OTHER): Payer: Self-pay

## 2021-06-17 ENCOUNTER — Other Ambulatory Visit (HOSPITAL_COMMUNITY): Payer: Self-pay

## 2021-08-12 ENCOUNTER — Other Ambulatory Visit (HOSPITAL_COMMUNITY): Payer: Self-pay

## 2021-08-24 DIAGNOSIS — R5383 Other fatigue: Secondary | ICD-10-CM | POA: Diagnosis not present

## 2021-08-24 DIAGNOSIS — L608 Other nail disorders: Secondary | ICD-10-CM | POA: Diagnosis not present

## 2021-08-24 DIAGNOSIS — R202 Paresthesia of skin: Secondary | ICD-10-CM | POA: Diagnosis not present

## 2021-08-24 DIAGNOSIS — E039 Hypothyroidism, unspecified: Secondary | ICD-10-CM | POA: Diagnosis not present

## 2021-08-24 DIAGNOSIS — E559 Vitamin D deficiency, unspecified: Secondary | ICD-10-CM | POA: Diagnosis not present

## 2021-08-24 DIAGNOSIS — E538 Deficiency of other specified B group vitamins: Secondary | ICD-10-CM | POA: Diagnosis not present

## 2021-08-24 DIAGNOSIS — R234 Changes in skin texture: Secondary | ICD-10-CM | POA: Diagnosis not present

## 2021-09-01 ENCOUNTER — Other Ambulatory Visit (HOSPITAL_COMMUNITY): Payer: Self-pay

## 2021-09-09 DIAGNOSIS — L301 Dyshidrosis [pompholyx]: Secondary | ICD-10-CM | POA: Diagnosis not present

## 2021-09-21 ENCOUNTER — Other Ambulatory Visit (HOSPITAL_COMMUNITY): Payer: Self-pay

## 2021-09-22 ENCOUNTER — Other Ambulatory Visit (HOSPITAL_COMMUNITY): Payer: Self-pay

## 2021-09-22 MED ORDER — LEVOTHYROXINE SODIUM 125 MCG PO TABS
125.0000 ug | ORAL_TABLET | Freq: Every day | ORAL | 1 refills | Status: DC
  Filled 2021-09-22: qty 90, 90d supply, fill #0
  Filled 2021-12-15: qty 90, 90d supply, fill #1

## 2021-11-10 ENCOUNTER — Other Ambulatory Visit (HOSPITAL_COMMUNITY): Payer: Self-pay

## 2021-12-02 ENCOUNTER — Other Ambulatory Visit (HOSPITAL_COMMUNITY): Payer: Self-pay

## 2021-12-02 DIAGNOSIS — Z1159 Encounter for screening for other viral diseases: Secondary | ICD-10-CM | POA: Diagnosis not present

## 2021-12-02 DIAGNOSIS — Z0000 Encounter for general adult medical examination without abnormal findings: Secondary | ICD-10-CM | POA: Diagnosis not present

## 2021-12-02 DIAGNOSIS — E039 Hypothyroidism, unspecified: Secondary | ICD-10-CM | POA: Diagnosis not present

## 2021-12-03 ENCOUNTER — Other Ambulatory Visit (HOSPITAL_COMMUNITY): Payer: Self-pay

## 2021-12-15 ENCOUNTER — Other Ambulatory Visit (HOSPITAL_COMMUNITY): Payer: Self-pay

## 2022-02-16 ENCOUNTER — Other Ambulatory Visit (HOSPITAL_COMMUNITY): Payer: Self-pay

## 2022-02-17 ENCOUNTER — Other Ambulatory Visit (HOSPITAL_COMMUNITY): Payer: Self-pay

## 2022-03-03 ENCOUNTER — Other Ambulatory Visit (HOSPITAL_COMMUNITY): Payer: Self-pay

## 2022-03-03 DIAGNOSIS — N959 Unspecified menopausal and perimenopausal disorder: Secondary | ICD-10-CM | POA: Diagnosis not present

## 2022-03-03 DIAGNOSIS — Z6825 Body mass index (BMI) 25.0-25.9, adult: Secondary | ICD-10-CM | POA: Diagnosis not present

## 2022-03-03 DIAGNOSIS — Z01419 Encounter for gynecological examination (general) (routine) without abnormal findings: Secondary | ICD-10-CM | POA: Diagnosis not present

## 2022-03-03 DIAGNOSIS — Z1231 Encounter for screening mammogram for malignant neoplasm of breast: Secondary | ICD-10-CM | POA: Diagnosis not present

## 2022-03-03 DIAGNOSIS — N951 Menopausal and female climacteric states: Secondary | ICD-10-CM | POA: Diagnosis not present

## 2022-03-03 MED ORDER — PROGESTERONE MICRONIZED 100 MG PO CAPS
100.0000 mg | ORAL_CAPSULE | Freq: Every day | ORAL | 3 refills | Status: DC
  Filled 2022-03-03 – 2022-03-17 (×2): qty 90, 90d supply, fill #0
  Filled 2022-06-13: qty 90, 90d supply, fill #1
  Filled 2022-09-11 – 2022-09-14 (×2): qty 90, 90d supply, fill #2
  Filled 2022-12-09: qty 90, 90d supply, fill #3

## 2022-03-03 MED ORDER — PREMARIN 1.25 MG PO TABS
1.2500 mg | ORAL_TABLET | Freq: Every day | ORAL | 3 refills | Status: DC
  Filled 2022-03-03: qty 90, 90d supply, fill #0
  Filled 2022-06-13: qty 90, 90d supply, fill #1
  Filled 2022-09-11 – 2022-09-14 (×2): qty 90, 90d supply, fill #2
  Filled 2022-12-09: qty 90, 90d supply, fill #3

## 2022-03-04 ENCOUNTER — Other Ambulatory Visit (HOSPITAL_COMMUNITY): Payer: Self-pay

## 2022-03-15 ENCOUNTER — Other Ambulatory Visit (HOSPITAL_COMMUNITY): Payer: Self-pay

## 2022-03-15 MED ORDER — LEVOTHYROXINE SODIUM 125 MCG PO TABS
125.0000 ug | ORAL_TABLET | Freq: Every day | ORAL | 1 refills | Status: DC
  Filled 2022-03-15: qty 90, 90d supply, fill #0
  Filled 2022-06-14: qty 90, 90d supply, fill #1

## 2022-03-19 ENCOUNTER — Other Ambulatory Visit (HOSPITAL_COMMUNITY): Payer: Self-pay

## 2022-04-07 ENCOUNTER — Other Ambulatory Visit (HOSPITAL_COMMUNITY): Payer: Self-pay

## 2022-06-02 DIAGNOSIS — Z13820 Encounter for screening for osteoporosis: Secondary | ICD-10-CM | POA: Diagnosis not present

## 2022-06-14 ENCOUNTER — Other Ambulatory Visit (HOSPITAL_COMMUNITY): Payer: Self-pay

## 2022-09-11 ENCOUNTER — Other Ambulatory Visit (HOSPITAL_COMMUNITY): Payer: Self-pay

## 2022-09-13 ENCOUNTER — Other Ambulatory Visit (HOSPITAL_COMMUNITY): Payer: Self-pay

## 2022-09-13 MED ORDER — LEVOTHYROXINE SODIUM 125 MCG PO TABS
125.0000 ug | ORAL_TABLET | Freq: Every day | ORAL | 0 refills | Status: DC
  Filled 2022-09-13: qty 90, 90d supply, fill #0

## 2022-09-14 ENCOUNTER — Other Ambulatory Visit (HOSPITAL_COMMUNITY): Payer: Self-pay

## 2022-09-14 ENCOUNTER — Other Ambulatory Visit: Payer: Self-pay

## 2022-12-09 ENCOUNTER — Other Ambulatory Visit (HOSPITAL_COMMUNITY): Payer: Self-pay

## 2022-12-09 ENCOUNTER — Other Ambulatory Visit: Payer: Self-pay

## 2022-12-09 MED ORDER — LEVOTHYROXINE SODIUM 125 MCG PO TABS
125.0000 ug | ORAL_TABLET | Freq: Every day | ORAL | 0 refills | Status: DC
  Filled 2022-12-09: qty 90, 90d supply, fill #0

## 2023-01-05 ENCOUNTER — Other Ambulatory Visit (HOSPITAL_COMMUNITY): Payer: Self-pay

## 2023-01-05 DIAGNOSIS — Z0000 Encounter for general adult medical examination without abnormal findings: Secondary | ICD-10-CM | POA: Diagnosis not present

## 2023-01-05 DIAGNOSIS — E039 Hypothyroidism, unspecified: Secondary | ICD-10-CM | POA: Diagnosis not present

## 2023-01-05 DIAGNOSIS — E559 Vitamin D deficiency, unspecified: Secondary | ICD-10-CM | POA: Diagnosis not present

## 2023-01-05 DIAGNOSIS — E538 Deficiency of other specified B group vitamins: Secondary | ICD-10-CM | POA: Diagnosis not present

## 2023-01-05 DIAGNOSIS — Z1331 Encounter for screening for depression: Secondary | ICD-10-CM | POA: Diagnosis not present

## 2023-01-05 DIAGNOSIS — L301 Dyshidrosis [pompholyx]: Secondary | ICD-10-CM | POA: Diagnosis not present

## 2023-01-05 DIAGNOSIS — R3915 Urgency of urination: Secondary | ICD-10-CM | POA: Diagnosis not present

## 2023-01-05 MED ORDER — TRIAMCINOLONE ACETONIDE 0.1 % EX CREA
1.0000 | TOPICAL_CREAM | Freq: Two times a day (BID) | CUTANEOUS | 3 refills | Status: AC | PRN
  Filled 2023-01-05: qty 60, 30d supply, fill #0

## 2023-02-28 ENCOUNTER — Other Ambulatory Visit (HOSPITAL_COMMUNITY): Payer: Self-pay

## 2023-02-28 ENCOUNTER — Other Ambulatory Visit: Payer: Self-pay

## 2023-02-28 MED ORDER — PREMARIN 1.25 MG PO TABS
1.2500 mg | ORAL_TABLET | Freq: Every day | ORAL | 0 refills | Status: DC
  Filled 2023-02-28: qty 90, 90d supply, fill #0

## 2023-02-28 MED ORDER — LEVOTHYROXINE SODIUM 125 MCG PO TABS
125.0000 ug | ORAL_TABLET | Freq: Every day | ORAL | 0 refills | Status: DC
  Filled 2023-02-28: qty 90, 90d supply, fill #0

## 2023-02-28 MED ORDER — PROGESTERONE MICRONIZED 100 MG PO CAPS
100.0000 mg | ORAL_CAPSULE | Freq: Every day | ORAL | 0 refills | Status: DC
  Filled 2023-02-28: qty 90, 90d supply, fill #0

## 2023-04-13 ENCOUNTER — Other Ambulatory Visit (HOSPITAL_COMMUNITY): Payer: Self-pay

## 2023-04-13 DIAGNOSIS — Z01419 Encounter for gynecological examination (general) (routine) without abnormal findings: Secondary | ICD-10-CM | POA: Diagnosis not present

## 2023-04-13 DIAGNOSIS — Z1231 Encounter for screening mammogram for malignant neoplasm of breast: Secondary | ICD-10-CM | POA: Diagnosis not present

## 2023-04-13 DIAGNOSIS — Z6828 Body mass index (BMI) 28.0-28.9, adult: Secondary | ICD-10-CM | POA: Diagnosis not present

## 2023-04-13 DIAGNOSIS — E039 Hypothyroidism, unspecified: Secondary | ICD-10-CM | POA: Insufficient documentation

## 2023-04-13 MED ORDER — PROGESTERONE MICRONIZED 100 MG PO CAPS
100.0000 mg | ORAL_CAPSULE | Freq: Every day | ORAL | 3 refills | Status: DC
  Filled 2023-04-13 – 2023-06-19 (×2): qty 90, 90d supply, fill #0
  Filled 2023-09-17: qty 90, 90d supply, fill #1
  Filled 2023-12-17: qty 90, 90d supply, fill #2
  Filled 2024-03-19: qty 90, 90d supply, fill #3

## 2023-04-13 MED ORDER — PREMARIN 1.25 MG PO TABS
1.2500 mg | ORAL_TABLET | Freq: Every day | ORAL | 3 refills | Status: DC
  Filled 2023-04-13 – 2023-06-19 (×2): qty 90, 90d supply, fill #0
  Filled 2023-09-17: qty 90, 90d supply, fill #1
  Filled 2023-12-17: qty 90, 90d supply, fill #2
  Filled 2024-03-19: qty 90, 90d supply, fill #3

## 2023-06-19 ENCOUNTER — Other Ambulatory Visit (HOSPITAL_COMMUNITY): Payer: Self-pay

## 2023-06-20 ENCOUNTER — Other Ambulatory Visit (HOSPITAL_COMMUNITY): Payer: Self-pay

## 2023-06-20 MED ORDER — LEVOTHYROXINE SODIUM 125 MCG PO TABS
125.0000 ug | ORAL_TABLET | Freq: Every day | ORAL | 0 refills | Status: DC
Start: 2023-06-20 — End: 2023-09-19
  Filled 2023-06-20: qty 90, 90d supply, fill #0

## 2023-07-20 ENCOUNTER — Encounter: Payer: Self-pay | Admitting: Sports Medicine

## 2023-07-20 ENCOUNTER — Other Ambulatory Visit: Payer: Self-pay

## 2023-07-20 ENCOUNTER — Ambulatory Visit: Admitting: Sports Medicine

## 2023-07-20 DIAGNOSIS — M25512 Pain in left shoulder: Secondary | ICD-10-CM | POA: Diagnosis not present

## 2023-07-20 DIAGNOSIS — G8929 Other chronic pain: Secondary | ICD-10-CM | POA: Diagnosis not present

## 2023-07-20 MED ORDER — LIDOCAINE HCL 1 % IJ SOLN
2.0000 mL | INTRAMUSCULAR | Status: AC | PRN
  Administered 2023-07-20: 2 mL

## 2023-07-20 MED ORDER — BUPIVACAINE HCL 0.25 % IJ SOLN
2.0000 mL | INTRAMUSCULAR | Status: AC | PRN
  Administered 2023-07-20: 2 mL via INTRA_ARTICULAR

## 2023-07-20 MED ORDER — METHYLPREDNISOLONE ACETATE 40 MG/ML IJ SUSP
60.0000 mg | INTRAMUSCULAR | Status: AC | PRN
  Administered 2023-07-20: 60 mg via INTRA_ARTICULAR

## 2023-07-20 NOTE — Progress Notes (Signed)
 Courtney Rose - 53 y.o. female MRN [id number]  Date of birth: 06/09/1970  Office Visit Note: Visit Date: 07/20/2023 PCP: Lonie Peak, PA-C Referred by: Lonie Peak, PA-C  Subjective: Chief Complaint  Patient presents with   Left Shoulder - Pain   HPI: Courtney Rose is a pleasant 53 y.o. female who presents today for chronic left shoulder pain x 3-4 months.  Courtney Rose has been having pain in the left shoulder for the last few months.  This is worse with certain reaching motions such as putting on her jacket. She does work for [employer] and does work as a Stage manager in the OR.  Years ago she had a similar issue for the right shoulder and did have an MRI which showed a small tear of the supraspinatus.  She was able to heal this conservatively.  She has not done any sort of rehab for the shoulder yet.  Pertinent ROS were reviewed with the patient and found to be negative unless otherwise specified above in HPI.   Assessment & Plan: Visit Diagnoses:  1. Chronic left shoulder pain    Plan: Impression is left shoulder pain for the last 3-4 months without inciting injury which her exam and symptoms do seem to fit more of a rotator cuff pathology.  She does not have gross weakness however, so doubt full-thickness tearing.  She does have some pain near the anterior shoulder and into the subscapularis and bicep tendon which may also be involved.  She would rather not move forward with an MRI at this time, which is quite understanding.  Through shared decision making, we did proceed with ultrasound-guided injection into the shoulder.  Patient tolerated well.  Advised on 48 to 72 hours of modified rest/activity.  I then would like her to get started on a home exercise plan for the rotator cuff and shoulder stability.  We did print out a customized handout for her.  Starting this weekend, she may begin these performing them once daily.  Follow-up: Return if symptoms worsen or fail to improve.   Meds &  Orders: No orders of the defined types were placed in this encounter.   Orders Placed This Encounter  Procedures   Large Joint Inj: L glenohumeral   US Guided Needle Placement - No Linked Charges     Procedures: Large Joint Inj: L glenohumeral on 07/20/2023 9:53 AM Indications: pain Details: 22 G 3.5 in needle, ultrasound-guided posterior approach Medications: 2 mL lidocaine 1 %; 2 mL bupivacaine 0.25 %; 60 mg methylPREDNISolone acetate 40 MG/ML Outcome: tolerated well, no immediate complications  US-guided glenohumeral joint injection, left shoulder After discussion on risks/benefits/indications, informed verbal consent was obtained. A timeout was then performed. The patient was positioned lying lateral recumbent on examination table. The patient's shoulder was prepped with betadine and multiple alcohol swabs and utilizing ultrasound guidance, the patient's glenohumeral joint was identified on ultrasound. Using ultrasound guidance a 22-gauge, 3.5 inch needle with a mixture of 2:2:1.5 cc's lidocaine:bupivicaine:depomedrol was directed from a lateral to medial direction via in-plane technique into the glenohumeral joint with visualization of appropriate spread of injectate into the joint. Patient tolerated the procedure well without immediate complications.      Procedure, treatment alternatives, risks and benefits explained, specific risks discussed. Consent was given by the patient. Immediately prior to procedure a time out was called to verify the correct patient, procedure, equipment, support staff and site/side marked as required. Patient was prepped and draped in the usual sterile fashion.  Clinical History: No specialty comments available.  She reports that she has never smoked. She has never used smokeless tobacco. No results for input(s): "HGBA1C", "LABURIC" in the last 8760 hours.  Objective:    Physical Exam  Gen: Well-appearing, in no acute distress; non-toxic CV:  Well-perfused. Warm.  Resp: Breathing unlabored on room air; no wheezing. Psych: Fluid speech in conversation; appropriate affect; normal thought process  Ortho Exam - Left shoulder: There is some mild TTP over the anterior joint recess of the shoulder and near the insertion of the subscapularis.  There is full range of motion active and passively although pain at end range motion specifically reaching back with internal rotation.  There is no gross weakness with rotator cuff testing.  There is pain with Gerber liftoff, equivocal empty can testing.   Imaging:  *Previous contralateral shoulder: Narrative & Impression  CLINICAL DATA:  Intermittent shoulder pain for 1 year worsening after falling 5 months ago. Concern for labral tear. No previous relevant surgery.   EXAM: MR ARTHROGRAM OF THE RIGHT SHOULDER   TECHNIQUE: Multiplanar, multisequence MR imaging of the right shoulder was performed following the administration of intra-articular contrast.   CONTRAST:  See Injection Documentation.   COMPARISON:  Injection images same date.  Radiographs 09/06/2019   FINDINGS: Rotator cuff: Supraspinatus tendinosis with small focus of partial intrasubstance insertional tearing posteriorly, best seen on the T2 weighted images. There is no extension of contrast into this partial tear. There is no full-thickness tendon tear or retraction. The infraspinatus, subscapularis and teres minor tendons appear normal.   Muscles:  No focal muscular atrophy or edema.   Biceps long head:  Intact and normally positioned.   Acromioclavicular Joint: The acromion is type 1. There are mild acromioclavicular degenerative changes. No significant fluid or contrast in the subacromial-subdeltoid bursa.   Glenohumeral Joint: The shoulder joint is well distended with contrast and appears somewhat patulous. No significant glenohumeral arthropathy.   Labrum:  No evidence of labral tear or paralabral cyst.    Bones: No acute or significant extra-articular osseous findings.   Other: No significant soft tissue findings.   IMPRESSION: 1. Supraspinatus tendinosis with small focus of partial intrasubstance insertional tearing posteriorly. No full-thickness tendon tear or retraction. 2. The labrum and biceps tendon appear intact. 3. Mild acromioclavicular degenerative changes.     Electronically Signed   By: Carey Bullocks M.D.   On: 10/15/2019 11:54    Past Medical/Family/Surgical/Social History: Medications & Allergies reviewed per EMR, new medications updated. There are no active problems to display for this patient.  Past Medical History:  Diagnosis Date   GERD (gastroesophageal reflux disease)    Hypothyroidism    Family History  Problem Relation Age of Onset   Colon cancer Neg Hx    Colon polyps Neg Hx    Esophageal cancer Neg Hx    Rectal cancer Neg Hx    Stomach cancer Neg Hx    Past Surgical History:  Procedure Laterality Date   CESAREAN SECTION     CHOLECYSTECTOMY N/A 12/08/2017   Procedure: LAPAROSCOPIC CHOLECYSTECTOMY ERAS PATHWAY;  Surgeon: Emelia Loron, MD;  Location: [facility];  Service: General;  Laterality: N/A;   WISDOM TOOTH EXTRACTION     Social History   Occupational History   Not on file  Tobacco Use   Smoking status: Never   Smokeless tobacco: Never  Vaping Use   Vaping status: Never Used  Substance and Sexual Activity   Alcohol use: Never   Drug  use: Never   Sexual activity: Not on file

## 2023-09-09 ENCOUNTER — Other Ambulatory Visit (HOSPITAL_COMMUNITY): Payer: Self-pay

## 2023-09-09 DIAGNOSIS — E538 Deficiency of other specified B group vitamins: Secondary | ICD-10-CM | POA: Diagnosis not present

## 2023-09-09 DIAGNOSIS — G4730 Sleep apnea, unspecified: Secondary | ICD-10-CM | POA: Diagnosis not present

## 2023-09-09 DIAGNOSIS — R400 Somnolence: Secondary | ICD-10-CM | POA: Diagnosis not present

## 2023-09-09 DIAGNOSIS — R5383 Other fatigue: Secondary | ICD-10-CM | POA: Diagnosis not present

## 2023-09-09 DIAGNOSIS — R002 Palpitations: Secondary | ICD-10-CM | POA: Diagnosis not present

## 2023-09-09 DIAGNOSIS — F4321 Adjustment disorder with depressed mood: Secondary | ICD-10-CM | POA: Diagnosis not present

## 2023-09-09 MED ORDER — BUPROPION HCL ER (XL) 150 MG PO TB24
150.0000 mg | ORAL_TABLET | Freq: Every morning | ORAL | 1 refills | Status: AC
  Filled 2023-09-09: qty 30, 30d supply, fill #0
  Filled 2024-01-02: qty 30, 30d supply, fill #1

## 2023-09-09 MED ORDER — METOPROLOL TARTRATE 25 MG PO TABS
25.0000 mg | ORAL_TABLET | Freq: Two times a day (BID) | ORAL | 0 refills | Status: AC
  Filled 2023-09-09: qty 30, 15d supply, fill #0

## 2023-09-17 ENCOUNTER — Other Ambulatory Visit (HOSPITAL_COMMUNITY): Payer: Self-pay

## 2023-09-18 ENCOUNTER — Other Ambulatory Visit: Payer: Self-pay

## 2023-09-19 ENCOUNTER — Other Ambulatory Visit (HOSPITAL_COMMUNITY): Payer: Self-pay

## 2023-09-19 MED ORDER — LEVOTHYROXINE SODIUM 125 MCG PO TABS
125.0000 ug | ORAL_TABLET | Freq: Every day | ORAL | 0 refills | Status: AC
  Filled 2023-09-19: qty 30, 30d supply, fill #0

## 2023-09-20 ENCOUNTER — Other Ambulatory Visit (HOSPITAL_COMMUNITY): Payer: Self-pay

## 2023-09-20 ENCOUNTER — Other Ambulatory Visit: Payer: Self-pay

## 2023-09-21 ENCOUNTER — Other Ambulatory Visit (HOSPITAL_COMMUNITY): Payer: Self-pay

## 2023-09-22 ENCOUNTER — Other Ambulatory Visit (HOSPITAL_COMMUNITY): Payer: Self-pay

## 2023-09-28 ENCOUNTER — Other Ambulatory Visit (HOSPITAL_COMMUNITY): Payer: Self-pay

## 2023-09-28 DIAGNOSIS — R002 Palpitations: Secondary | ICD-10-CM | POA: Diagnosis not present

## 2023-09-28 DIAGNOSIS — G4730 Sleep apnea, unspecified: Secondary | ICD-10-CM | POA: Diagnosis not present

## 2023-09-28 DIAGNOSIS — E538 Deficiency of other specified B group vitamins: Secondary | ICD-10-CM | POA: Diagnosis not present

## 2023-09-28 DIAGNOSIS — F4321 Adjustment disorder with depressed mood: Secondary | ICD-10-CM | POA: Diagnosis not present

## 2023-09-28 MED ORDER — BUPROPION HCL ER (XL) 150 MG PO TB24
150.0000 mg | ORAL_TABLET | Freq: Every morning | ORAL | 1 refills | Status: DC
  Filled 2023-09-28 – 2023-10-03 (×2): qty 90, 90d supply, fill #0
  Filled 2024-02-01: qty 90, 90d supply, fill #1

## 2023-10-03 ENCOUNTER — Other Ambulatory Visit: Payer: Self-pay

## 2023-12-08 ENCOUNTER — Other Ambulatory Visit (HOSPITAL_COMMUNITY): Payer: Self-pay

## 2023-12-08 MED ORDER — LEVOTHYROXINE SODIUM 125 MCG PO TABS
125.0000 ug | ORAL_TABLET | Freq: Every day | ORAL | 0 refills | Status: DC
  Filled 2023-12-08: qty 90, 90d supply, fill #0

## 2023-12-19 ENCOUNTER — Other Ambulatory Visit (HOSPITAL_COMMUNITY): Payer: Self-pay

## 2023-12-20 ENCOUNTER — Other Ambulatory Visit (HOSPITAL_COMMUNITY): Payer: Self-pay

## 2023-12-27 DIAGNOSIS — H5213 Myopia, bilateral: Secondary | ICD-10-CM | POA: Diagnosis not present

## 2024-01-11 DIAGNOSIS — E538 Deficiency of other specified B group vitamins: Secondary | ICD-10-CM | POA: Diagnosis not present

## 2024-01-11 DIAGNOSIS — G4733 Obstructive sleep apnea (adult) (pediatric): Secondary | ICD-10-CM | POA: Diagnosis not present

## 2024-01-11 DIAGNOSIS — R3915 Urgency of urination: Secondary | ICD-10-CM | POA: Diagnosis not present

## 2024-01-11 DIAGNOSIS — L301 Dyshidrosis [pompholyx]: Secondary | ICD-10-CM | POA: Diagnosis not present

## 2024-01-11 DIAGNOSIS — R7303 Prediabetes: Secondary | ICD-10-CM | POA: Diagnosis not present

## 2024-01-11 DIAGNOSIS — E559 Vitamin D deficiency, unspecified: Secondary | ICD-10-CM | POA: Diagnosis not present

## 2024-01-11 DIAGNOSIS — E039 Hypothyroidism, unspecified: Secondary | ICD-10-CM | POA: Diagnosis not present

## 2024-01-11 DIAGNOSIS — Z0000 Encounter for general adult medical examination without abnormal findings: Secondary | ICD-10-CM | POA: Diagnosis not present

## 2024-01-11 DIAGNOSIS — F4321 Adjustment disorder with depressed mood: Secondary | ICD-10-CM | POA: Diagnosis not present

## 2024-02-08 DIAGNOSIS — E039 Hypothyroidism, unspecified: Secondary | ICD-10-CM | POA: Diagnosis not present

## 2024-02-27 ENCOUNTER — Encounter: Payer: Self-pay | Admitting: Radiology

## 2024-03-19 ENCOUNTER — Other Ambulatory Visit (HOSPITAL_COMMUNITY): Payer: Self-pay

## 2024-03-20 ENCOUNTER — Other Ambulatory Visit (HOSPITAL_COMMUNITY): Payer: Self-pay

## 2024-03-20 ENCOUNTER — Other Ambulatory Visit: Payer: Self-pay

## 2024-03-21 ENCOUNTER — Other Ambulatory Visit (HOSPITAL_COMMUNITY): Payer: Self-pay

## 2024-03-21 MED ORDER — LEVOTHYROXINE SODIUM 125 MCG PO TABS
125.0000 ug | ORAL_TABLET | Freq: Every day | ORAL | 0 refills | Status: AC
  Filled 2024-03-21: qty 90, 90d supply, fill #0

## 2024-03-30 ENCOUNTER — Inpatient Hospital Stay: Admission: RE | Admit: 2024-03-30 | Discharge: 2024-03-30

## 2024-03-30 ENCOUNTER — Ambulatory Visit
Admission: RE | Admit: 2024-03-30 | Discharge: 2024-03-30 | Disposition: A | Discharge status: Home / Self Care | Source: Ambulatory Visit

## 2024-03-30 ENCOUNTER — Other Ambulatory Visit (HOSPITAL_COMMUNITY): Payer: Self-pay

## 2024-03-30 DIAGNOSIS — R051 Acute cough: Secondary | ICD-10-CM | POA: Diagnosis not present

## 2024-03-30 DIAGNOSIS — J40 Bronchitis, not specified as acute or chronic: Secondary | ICD-10-CM

## 2024-03-30 DIAGNOSIS — J329 Chronic sinusitis, unspecified: Secondary | ICD-10-CM

## 2024-03-30 DIAGNOSIS — G43909 Migraine, unspecified, not intractable, without status migrainosus: Secondary | ICD-10-CM | POA: Insufficient documentation

## 2024-03-30 MED ORDER — AMOXICILLIN-POT CLAVULANATE 875-125 MG PO TABS
1.0000 | ORAL_TABLET | Freq: Two times a day (BID) | ORAL | 0 refills | Status: AC
  Filled 2024-03-30: qty 14, 7d supply, fill #0

## 2024-03-30 MED ORDER — PREDNISONE 20 MG PO TABS
40.0000 mg | ORAL_TABLET | Freq: Every day | ORAL | 0 refills | Status: AC
  Filled 2024-03-30: qty 10, 5d supply, fill #0

## 2024-03-30 NOTE — ED Provider Notes (Signed)
 [facility]    CSN: [id number] Arrival date & time: 03/30/24  9157      History   Chief Complaint Chief Complaint  Patient presents with   Cough   Nasal Congestion    HPI Courtney Rose is a 53 y.o. female.   Patient presents today with a several week history of persistent URI symptoms including congestion, sinus pressure, cough.  Denies any fever, chest pain, shortness of breath.  Has been using Claritin as well as Mucinex without improvement of symptoms.  Symptoms began after a trip to [place] where she was exposed to many people but denies any specific sick contacts.  She was not evaluated when her symptoms first began and has not had any testing for viral illnesses.  She denies any recent antibiotics or steroids.  Denies history of asthma, COPD, smoking, diabetes.  She is having difficulty with her daily activities because of the symptoms including sleeping at night.    Past Medical History:  Diagnosis Date   GERD (gastroesophageal reflux disease)    Hypothyroidism     Patient Active Problem List   Diagnosis Date Noted   Migraine 03/30/2024   Hypothyroidism 04/13/2023   Epistaxis, recurrent 09/09/2015    Past Surgical History:  Procedure Laterality Date   CESAREAN SECTION     CHOLECYSTECTOMY N/A 12/08/2017   Procedure: LAPAROSCOPIC CHOLECYSTECTOMY ERAS PATHWAY;  Surgeon: Ebbie Cough, MD;  Location: [facility] OR;  Service: General;  Laterality: N/A;   WISDOM TOOTH EXTRACTION      OB History   No obstetric history on file.      Home Medications    Prior to Admission medications   Medication Sig Start Date End Date Taking? Authorizing Provider  amoxicillin -clavulanate (AUGMENTIN ) 875-125 MG tablet Take 1 tablet by mouth every 12 (twelve) hours. 03/30/24  Yes Johne Buckle K, PA-C  levothyroxine  (SYNTHROID ) 125 MCG tablet Take 1 tablet (125 mcg total) by mouth daily. 09/19/23  Yes   predniSONE  (DELTASONE ) 20 MG tablet Take 2 tablets (40 mg total)  by mouth daily for 5 days. 03/30/24 04/04/24 Yes Aishia Barkey K, PA-C  acetaminophen  (TYLENOL ) 500 MG tablet Take 500 mg by mouth daily as needed for moderate pain or headache.    [provider]  aspirin-acetaminophen -caffeine (EXCEDRIN MIGRAINE) 250-250-65 MG tablet Take 1-2 tablets by mouth daily as needed for headache.    [provider]  buPROPion  (WELLBUTRIN  XL) 150 MG 24 hr tablet Take 1 tablet (150 mg total) by mouth every morning. 09/09/23     buPROPion  (WELLBUTRIN  XL) 150 MG 24 hr tablet Take 1 tablet (150 mg total) by mouth every morning. 09/28/23     Cholecalciferol (VITAMIN D3) 5000 units CAPS Take 5,000 Units by mouth daily.    [provider]  estrogens , conjugated, (PREMARIN ) 1.25 MG tablet Take 1 tablet (1.25 mg total) by mouth daily. 04/13/23     levothyroxine  (SYNTHROID ) 125 MCG tablet Take 1 tablet (125 mcg total) by mouth daily. 03/21/24     metoprolol  tartrate (LOPRESSOR ) 25 MG tablet Take 1 tablet (25 mg total) by mouth 2 (two) times daily if needed for palpitations. 09/09/23     naproxen sodium (ALEVE) 220 MG tablet Take 220 mg by mouth daily as needed (pain).    [provider]  PREMARIN  1.25 MG tablet Take 1.25 mg by mouth at bedtime. 11/25/17   [provider]  progesterone  (PROMETRIUM ) 100 MG capsule Take 100 mg by mouth at bedtime. 09/01/17   [provider]  progesterone  (PROMETRIUM ) 100 MG capsule Take 1 capsule (100 mg total) by mouth daily. 02/23/21   Rosalynn LELON Ingle, MD  progesterone  (PROMETRIUM ) 100 MG capsule Take 1 capsule (100 mg total) by mouth daily. 04/13/23   Mat Browning, MD  triamcinolone  cream (KENALOG ) 0.1 % Apply a thin layer of cream to affected area topically 2 (two) times daily as needed for eczema symptoms. 01/05/23     vitamin B-12 (CYANOCOBALAMIN ) 1000 MCG tablet Take 2,000 mcg by mouth daily.    [provider]  VITAMIN D, ERGOCALCIFEROL, PO     [provider]    Family  History Family History  Problem Relation Age of Onset   Colon cancer Neg Hx    Colon polyps Neg Hx    Esophageal cancer Neg Hx    Rectal cancer Neg Hx    Stomach cancer Neg Hx     Social History Social History   Tobacco Use   Smoking status: Never   Smokeless tobacco: Never  Vaping Use   Vaping status: Never Used  Substance Use Topics   Alcohol use: Never   Drug use: Never     Allergies   Adhesive [tape], Strawberry flavoring agent (non-screening), Sulfa antibiotics, and Sulfamethoxazole   Review of Systems Review of Systems  Constitutional:  Positive for activity change. Negative for appetite change, fatigue and fever.  HENT:  Positive for congestion and sinus pressure. Negative for sneezing and sore throat.   Respiratory:  Positive for cough. Negative for shortness of breath.   Cardiovascular:  Negative for chest pain.  Gastrointestinal:  Negative for diarrhea, nausea and vomiting.  Psychiatric/Behavioral:  Positive for sleep disturbance.      Physical Exam Triage Vital Signs ED Triage Vitals  Encounter Vitals Group     BP 03/30/24 0926 100/65     Girls Systolic BP Percentile --      Girls Diastolic BP Percentile --      Boys Systolic BP Percentile --      Boys Diastolic BP Percentile --      Pulse Rate 03/30/24 0926 75     Resp 03/30/24 0926 18     Temp 03/30/24 0926 97.7 F (36.5 C)     Temp Source 03/30/24 0926 Oral     SpO2 03/30/24 0926 98 %     Weight 03/30/24 0924 130 lb (59 kg)     Height 03/30/24 0924 5' 4 (1.626 m)     Head Circumference --      Peak Flow --      Pain Score 03/30/24 0922 0     Pain Loc --      Pain Education --      Exclude from Growth Chart --    No data found.  Updated Vital Signs BP 100/65 (BP Location: Left Arm)   Pulse 75   Temp 97.7 F (36.5 C) (Oral)   Resp 18   Ht 5' 4 (1.626 m)   Wt 130 lb (59 kg)   SpO2 98%   BMI 22.31 kg/m   Visual Acuity Right Eye Distance:   Left Eye Distance:   Bilateral  Distance:    Right Eye Near:   Left Eye Near:    Bilateral Near:     Physical Exam Vitals reviewed.  Constitutional:      General: She is awake. She is not in acute distress.    Appearance: Normal appearance. She is well-developed. She is not ill-appearing.  Comments: Very pleasant female appears stated age in no acute distress sitting comfortable in exam room  HENT:     Head: Normocephalic and atraumatic.     Right Ear: Tympanic membrane, ear canal and external ear normal. Tympanic membrane is not erythematous or bulging.     Left Ear: Tympanic membrane, ear canal and external ear normal. Tympanic membrane is not erythematous or bulging.     Nose:     Right Sinus: Maxillary sinus tenderness present. No frontal sinus tenderness.     Left Sinus: Maxillary sinus tenderness present. No frontal sinus tenderness.     Mouth/Throat:     Pharynx: Uvula midline. Postnasal drip present. No oropharyngeal exudate or posterior oropharyngeal erythema.  Cardiovascular:     Rate and Rhythm: Normal rate and regular rhythm.     Heart sounds: Normal heart sounds, S1 normal and S2 normal. No murmur heard. Pulmonary:     Effort: Pulmonary effort is normal.     Breath sounds: Normal breath sounds. No wheezing, rhonchi or rales.     Comments: Clear to auscultation bilaterally Psychiatric:        Behavior: Behavior is cooperative.      UC Treatments / Results  Labs (all labs ordered are listed, but only abnormal results are displayed) Labs Reviewed - No data to display  EKG   Radiology No results found.  Procedures Procedures (including critical care time)  Medications Ordered in UC Medications - No data to display  Initial Impression / Assessment and Plan / UC Course  I have reviewed the triage vital signs and the nursing notes.  Pertinent labs & imaging results that were available during my care of the patient were reviewed by me and considered in my medical decision making (see  chart for details).     Patient is well-appearing, afebrile, nontoxic, nontachycardic.  No indication for viral testing as patient has been symptomatic for several weeks and this would not change management.  Given prolonged and worsening symptoms concern for secondary bacterial infection.  Patient was started on Augmentin  twice daily for 7 days as well as prednisone  40 mg for 5 days.  We discussed that she is not to take NSAIDs with prednisone  due to risk of GI bleeding.  Recommended over-the-counter medication including Mucinex, Flonase, Tylenol .  They are to rest and drink plenty of fluid.  Discussed that if symptoms are not improving by next week they should return for reevaluation or see PCP at which point we consider a chest x-ray; chest x-ray was deferred today as she had no adventitious lung sounds on exam and oxygen saturation was 98%.  If any symptoms worsen and she develops high fever, chest pain, shortness of breath, nausea/vomiting interfering with oral intake, worsening cough they need to be seen immediately.  Strict return precautions given.  Work excuse note provided.  Final Clinical Impressions(s) / UC Diagnoses   Final diagnoses:  Sinobronchitis  Acute cough     Discharge Instructions      Take Augmentin  twice daily for 7 days.  Start prednisone  40 mg for 5 days.  Do not take NSAIDs with this medication including aspirin, ibuprofen/Advil, naproxen/Aleve while on prednisone .  I recommend over-the-counter medications including Mucinex, Flonase, Tylenol .  Use sinus rinses/nasal saline for additional symptom relief.  Make sure that you rest and drink plenty of fluid.  If your symptoms or not improving within a week please return for reevaluation.  If anything worsens and you have shortness of breath, worsening cough,  fever, nausea/vomiting interfere with oral intake, weakness you need to be seen immediately.      ED Prescriptions     Medication Sig Dispense Auth. Provider    amoxicillin -clavulanate (AUGMENTIN ) 875-125 MG tablet Take 1 tablet by mouth every 12 (twelve) hours. 14 tablet Dejanay Wamboldt K, PA-C   predniSONE  (DELTASONE ) 20 MG tablet Take 2 tablets (40 mg total) by mouth daily for 5 days. 10 tablet Jenae Tomasello K, PA-C      PDMP not reviewed this encounter.   Sherrell Rocky POUR, PA-C 03/30/24 1007

## 2024-03-30 NOTE — ED Triage Notes (Signed)
 Patient reports cough and cold for about 2-3 wks now. No fever.

## 2024-03-30 NOTE — Discharge Instructions (Signed)
 Take Augmentin  twice daily for 7 days.  Start prednisone  40 mg for 5 days.  Do not take NSAIDs with this medication including aspirin, ibuprofen/Advil, naproxen/Aleve while on prednisone .  I recommend over-the-counter medications including Mucinex, Flonase, Tylenol .  Use sinus rinses/nasal saline for additional symptom relief.  Make sure that you rest and drink plenty of fluid.  If your symptoms or not improving within a week please return for reevaluation.  If anything worsens and you have shortness of breath, worsening cough, fever, nausea/vomiting interfere with oral intake, weakness you need to be seen immediately.

## 2024-05-01 ENCOUNTER — Other Ambulatory Visit (HOSPITAL_COMMUNITY): Payer: Self-pay

## 2024-05-03 ENCOUNTER — Encounter (HOSPITAL_COMMUNITY): Payer: Self-pay

## 2024-05-03 ENCOUNTER — Other Ambulatory Visit (HOSPITAL_COMMUNITY): Payer: Self-pay

## 2024-05-03 MED ORDER — BUPROPION HCL ER (XL) 150 MG PO TB24
150.0000 mg | ORAL_TABLET | Freq: Every morning | ORAL | 1 refills | Status: AC
  Filled 2024-05-03 – 2024-05-29 (×2): qty 30, 30d supply, fill #0
  Filled 2024-05-29: qty 90, 90d supply, fill #0

## 2024-05-04 ENCOUNTER — Other Ambulatory Visit (HOSPITAL_COMMUNITY): Payer: Self-pay

## 2024-05-08 ENCOUNTER — Other Ambulatory Visit (HOSPITAL_COMMUNITY): Payer: Self-pay

## 2024-05-16 ENCOUNTER — Encounter (HOSPITAL_COMMUNITY): Payer: Self-pay

## 2024-05-16 ENCOUNTER — Other Ambulatory Visit (HOSPITAL_COMMUNITY): Payer: Self-pay

## 2024-05-16 MED ORDER — ESTROGENS CONJUGATED 1.25 MG PO TABS
1.2500 mg | ORAL_TABLET | Freq: Every day | ORAL | 3 refills | Status: AC
  Filled 2024-05-16: qty 90, 90d supply, fill #0

## 2024-05-16 MED ORDER — PROGESTERONE MICRONIZED 100 MG PO CAPS
100.0000 mg | ORAL_CAPSULE | Freq: Every day | ORAL | 3 refills | Status: AC
  Filled 2024-05-16: qty 90, 90d supply, fill #0

## 2024-05-29 ENCOUNTER — Other Ambulatory Visit (HOSPITAL_COMMUNITY): Payer: Self-pay

## 2024-05-29 ENCOUNTER — Other Ambulatory Visit: Payer: Self-pay
